# Patient Record
Sex: Female | Born: 1940 | Hispanic: No | Marital: Single | Smoking: Former smoker
Health system: Southern US, Community
[De-identification: ages and names within clinical notes are randomized; demographics above are authoritative.]

## PROBLEM LIST (undated history)

## (undated) DIAGNOSIS — E611 Iron deficiency: Secondary | ICD-10-CM

## (undated) DIAGNOSIS — M810 Age-related osteoporosis without current pathological fracture: Secondary | ICD-10-CM

## (undated) DIAGNOSIS — M5410 Radiculopathy, site unspecified: Secondary | ICD-10-CM

## (undated) DIAGNOSIS — Z9889 Other specified postprocedural states: Secondary | ICD-10-CM

## (undated) DIAGNOSIS — G894 Chronic pain syndrome: Secondary | ICD-10-CM

## (undated) DIAGNOSIS — I10 Essential (primary) hypertension: Secondary | ICD-10-CM

## (undated) DIAGNOSIS — F1120 Opioid dependence, uncomplicated: Secondary | ICD-10-CM

## (undated) DIAGNOSIS — N390 Urinary tract infection, site not specified: Secondary | ICD-10-CM

## (undated) DIAGNOSIS — C3411 Malignant neoplasm of upper lobe, right bronchus or lung: Secondary | ICD-10-CM

## (undated) DIAGNOSIS — E039 Hypothyroidism, unspecified: Secondary | ICD-10-CM

## (undated) DIAGNOSIS — F329 Major depressive disorder, single episode, unspecified: Secondary | ICD-10-CM

## (undated) DIAGNOSIS — N183 Chronic kidney disease, stage 3 (moderate): Secondary | ICD-10-CM

## (undated) DIAGNOSIS — G8929 Other chronic pain: Secondary | ICD-10-CM

## (undated) DIAGNOSIS — I824Y2 Acute embolism and thrombosis of unspecified deep veins of left proximal lower extremity: Secondary | ICD-10-CM

## (undated) DIAGNOSIS — N3020 Other chronic cystitis without hematuria: Secondary | ICD-10-CM

## (undated) DIAGNOSIS — M5136 Other intervertebral disc degeneration, lumbar region: Secondary | ICD-10-CM

## (undated) DIAGNOSIS — D649 Anemia, unspecified: Secondary | ICD-10-CM

## (undated) DIAGNOSIS — I739 Peripheral vascular disease, unspecified: Secondary | ICD-10-CM

## (undated) DIAGNOSIS — G40909 Epilepsy, unspecified, not intractable, without status epilepticus: Secondary | ICD-10-CM

## (undated) DIAGNOSIS — I82412 Acute embolism and thrombosis of left femoral vein: Secondary | ICD-10-CM

## (undated) DIAGNOSIS — J439 Emphysema, unspecified: Secondary | ICD-10-CM

## (undated) DIAGNOSIS — I6523 Occlusion and stenosis of bilateral carotid arteries: Secondary | ICD-10-CM

## (undated) DIAGNOSIS — Z79899 Other long term (current) drug therapy: Secondary | ICD-10-CM

## (undated) DIAGNOSIS — I119 Hypertensive heart disease without heart failure: Secondary | ICD-10-CM

## (undated) DIAGNOSIS — E785 Hyperlipidemia, unspecified: Secondary | ICD-10-CM

## (undated) DIAGNOSIS — Z9181 History of falling: Secondary | ICD-10-CM

## (undated) DIAGNOSIS — I771 Stricture of artery: Secondary | ICD-10-CM

## (undated) DIAGNOSIS — R6510 Systemic inflammatory response syndrome (SIRS) of non-infectious origin without acute organ dysfunction: Secondary | ICD-10-CM

## (undated) DIAGNOSIS — G6181 Chronic inflammatory demyelinating polyneuritis: Secondary | ICD-10-CM

## (undated) DIAGNOSIS — R0902 Hypoxemia: Secondary | ICD-10-CM

## (undated) DIAGNOSIS — M47817 Spondylosis without myelopathy or radiculopathy, lumbosacral region: Secondary | ICD-10-CM

## (undated) DIAGNOSIS — R55 Syncope and collapse: Secondary | ICD-10-CM

## (undated) DIAGNOSIS — J9610 Chronic respiratory failure, unspecified whether with hypoxia or hypercapnia: Secondary | ICD-10-CM

## (undated) DIAGNOSIS — I2510 Atherosclerotic heart disease of native coronary artery without angina pectoris: Secondary | ICD-10-CM

## (undated) DIAGNOSIS — K219 Gastro-esophageal reflux disease without esophagitis: Secondary | ICD-10-CM

## (undated) DIAGNOSIS — G039 Meningitis, unspecified: Secondary | ICD-10-CM

## (undated) DIAGNOSIS — M549 Dorsalgia, unspecified: Secondary | ICD-10-CM

## (undated) DIAGNOSIS — I6789 Other cerebrovascular disease: Secondary | ICD-10-CM

## (undated) DIAGNOSIS — M4726 Other spondylosis with radiculopathy, lumbar region: Secondary | ICD-10-CM

## (undated) DIAGNOSIS — Z86718 Personal history of other venous thrombosis and embolism: Secondary | ICD-10-CM

## (undated) DIAGNOSIS — M533 Sacrococcygeal disorders, not elsewhere classified: Secondary | ICD-10-CM

## (undated) DIAGNOSIS — R6251 Failure to thrive (child): Secondary | ICD-10-CM

## (undated) DIAGNOSIS — I2699 Other pulmonary embolism without acute cor pulmonale: Secondary | ICD-10-CM

## (undated) DIAGNOSIS — Z66 Do not resuscitate: Secondary | ICD-10-CM

## (undated) DIAGNOSIS — N281 Cyst of kidney, acquired: Secondary | ICD-10-CM

## (undated) DIAGNOSIS — I672 Cerebral atherosclerosis: Secondary | ICD-10-CM

## (undated) HISTORY — DX: Peripheral vascular disease, unspecified: I73.9

## (undated) HISTORY — DX: Hypertensive heart disease without heart failure: I11.9

## (undated) HISTORY — DX: Other long term (current) drug therapy: Z79.899

## (undated) HISTORY — DX: Acute embolism and thrombosis of unspecified deep veins of left proximal lower extremity: I82.4Y2

## (undated) HISTORY — DX: Other chronic pain: G89.29

## (undated) HISTORY — DX: Hypoxemia: R09.02

## (undated) HISTORY — DX: Other spondylosis with radiculopathy, lumbar region: M47.26

## (undated) HISTORY — DX: Sacrococcygeal disorders, not elsewhere classified: M53.3

## (undated) HISTORY — DX: Occlusion and stenosis of bilateral carotid arteries: I65.23

## (undated) HISTORY — DX: Hypothyroidism, unspecified: E03.9

## (undated) HISTORY — DX: Other intervertebral disc degeneration, lumbar region: M51.36

## (undated) HISTORY — DX: Acute embolism and thrombosis of left femoral vein: I82.412

## (undated) HISTORY — DX: Other pulmonary embolism without acute cor pulmonale: I26.99

## (undated) HISTORY — DX: Essential (primary) hypertension: I10

## (undated) HISTORY — DX: Chronic kidney disease, stage 3 (moderate): N18.3

## (undated) HISTORY — PX: JOINT REPLACEMENT: SHX530

## (undated) HISTORY — DX: Chronic inflammatory demyelinating polyneuritis: G61.81

## (undated) HISTORY — DX: Systemic inflammatory response syndrome (sirs) of non-infectious origin without acute organ dysfunction: R65.10

## (undated) HISTORY — DX: Stricture of artery: I77.1

## (undated) HISTORY — DX: Radiculopathy, site unspecified: M54.10

## (undated) HISTORY — DX: Urinary tract infection, site not specified: N39.0

## (undated) HISTORY — DX: Dorsalgia, unspecified: M54.9

## (undated) HISTORY — DX: Failure to thrive (child): R62.51

## (undated) HISTORY — DX: Epilepsy, unspecified, not intractable, without status epilepticus: G40.909

## (undated) HISTORY — DX: Gastro-esophageal reflux disease without esophagitis: K21.9

## (undated) HISTORY — DX: Anemia, unspecified: D64.9

## (undated) HISTORY — DX: Cerebral atherosclerosis: I67.2

## (undated) HISTORY — DX: Chronic pain syndrome: G89.4

## (undated) HISTORY — PX: BACK SURGERY: SHX140

## (undated) HISTORY — DX: Other specified postprocedural states: Z98.890

## (undated) HISTORY — DX: Age-related osteoporosis without current pathological fracture: M81.0

## (undated) HISTORY — DX: Spondylosis without myelopathy or radiculopathy, lumbosacral region: M47.817

## (undated) HISTORY — PX: APPENDECTOMY: SHX54

## (undated) HISTORY — DX: Hyperlipidemia, unspecified: E78.5

## (undated) HISTORY — DX: Do not resuscitate: Z66

## (undated) HISTORY — DX: Other chronic cystitis without hematuria: N30.20

## (undated) HISTORY — DX: Personal history of other venous thrombosis and embolism: Z86.718

## (undated) HISTORY — DX: Chronic respiratory failure, unspecified whether with hypoxia or hypercapnia: J96.10

## (undated) HISTORY — DX: History of falling: Z91.81

## (undated) HISTORY — DX: Malignant neoplasm of upper lobe, right bronchus or lung: C34.11

## (undated) HISTORY — DX: Atherosclerotic heart disease of native coronary artery without angina pectoris: I25.10

## (undated) HISTORY — DX: Opioid dependence, uncomplicated: F11.20

## (undated) HISTORY — DX: Cyst of kidney, acquired: N28.1

## (undated) HISTORY — DX: Emphysema, unspecified: J43.9

## (undated) HISTORY — DX: Other cerebrovascular disease: I67.89

## (undated) HISTORY — DX: Major depressive disorder, single episode, unspecified: F32.9

## (undated) HISTORY — DX: Iron deficiency: E61.1

## (undated) HISTORY — DX: Syncope and collapse: R55

## (undated) HISTORY — DX: Meningitis, unspecified: G03.9

---

## 1960-07-26 HISTORY — PX: VESICOVAGINAL FISTULA CLOSURE W/ TAH: SUR271

## 1988-07-26 HISTORY — PX: OTHER SURGICAL HISTORY: SHX169

## 2006-09-26 ENCOUNTER — Ambulatory Visit: Payer: Self-pay | Admitting: Vascular Surgery

## 2008-07-26 HISTORY — PX: SPINAL CORD STIMULATOR IMPLANT: SHX2422

## 2011-05-24 DIAGNOSIS — I10 Essential (primary) hypertension: Secondary | ICD-10-CM | POA: Insufficient documentation

## 2011-05-24 DIAGNOSIS — N183 Chronic kidney disease, stage 3 unspecified: Secondary | ICD-10-CM

## 2011-05-24 HISTORY — DX: Chronic kidney disease, stage 3 unspecified: N18.30

## 2011-05-24 HISTORY — DX: Essential (primary) hypertension: I10

## 2011-08-02 DIAGNOSIS — M810 Age-related osteoporosis without current pathological fracture: Secondary | ICD-10-CM | POA: Diagnosis not present

## 2011-08-02 DIAGNOSIS — K219 Gastro-esophageal reflux disease without esophagitis: Secondary | ICD-10-CM | POA: Diagnosis not present

## 2011-08-02 DIAGNOSIS — Z23 Encounter for immunization: Secondary | ICD-10-CM | POA: Diagnosis not present

## 2011-08-02 DIAGNOSIS — E039 Hypothyroidism, unspecified: Secondary | ICD-10-CM | POA: Diagnosis not present

## 2011-09-13 DIAGNOSIS — N182 Chronic kidney disease, stage 2 (mild): Secondary | ICD-10-CM | POA: Diagnosis not present

## 2011-09-13 DIAGNOSIS — Q619 Cystic kidney disease, unspecified: Secondary | ICD-10-CM | POA: Diagnosis not present

## 2011-09-13 DIAGNOSIS — I10 Essential (primary) hypertension: Secondary | ICD-10-CM | POA: Diagnosis not present

## 2011-09-13 DIAGNOSIS — I129 Hypertensive chronic kidney disease with stage 1 through stage 4 chronic kidney disease, or unspecified chronic kidney disease: Secondary | ICD-10-CM | POA: Diagnosis not present

## 2011-09-13 DIAGNOSIS — E875 Hyperkalemia: Secondary | ICD-10-CM | POA: Diagnosis not present

## 2011-09-14 DIAGNOSIS — E611 Iron deficiency: Secondary | ICD-10-CM | POA: Insufficient documentation

## 2011-09-14 HISTORY — DX: Iron deficiency: E61.1

## 2011-09-15 DIAGNOSIS — M48061 Spinal stenosis, lumbar region without neurogenic claudication: Secondary | ICD-10-CM | POA: Diagnosis not present

## 2011-09-15 DIAGNOSIS — G8929 Other chronic pain: Secondary | ICD-10-CM | POA: Diagnosis not present

## 2011-09-20 DIAGNOSIS — M519 Unspecified thoracic, thoracolumbar and lumbosacral intervertebral disc disorder: Secondary | ICD-10-CM | POA: Diagnosis not present

## 2011-09-20 DIAGNOSIS — M4310 Spondylolisthesis, site unspecified: Secondary | ICD-10-CM | POA: Diagnosis not present

## 2011-09-20 DIAGNOSIS — IMO0002 Reserved for concepts with insufficient information to code with codable children: Secondary | ICD-10-CM | POA: Diagnosis not present

## 2011-09-20 DIAGNOSIS — M47817 Spondylosis without myelopathy or radiculopathy, lumbosacral region: Secondary | ICD-10-CM | POA: Diagnosis not present

## 2011-10-20 DIAGNOSIS — I2510 Atherosclerotic heart disease of native coronary artery without angina pectoris: Secondary | ICD-10-CM | POA: Diagnosis not present

## 2011-10-20 DIAGNOSIS — Z01810 Encounter for preprocedural cardiovascular examination: Secondary | ICD-10-CM | POA: Diagnosis not present

## 2011-10-29 DIAGNOSIS — H3581 Retinal edema: Secondary | ICD-10-CM | POA: Diagnosis not present

## 2011-10-29 DIAGNOSIS — H2589 Other age-related cataract: Secondary | ICD-10-CM | POA: Diagnosis not present

## 2011-11-03 DIAGNOSIS — M79609 Pain in unspecified limb: Secondary | ICD-10-CM | POA: Diagnosis not present

## 2011-11-03 DIAGNOSIS — F329 Major depressive disorder, single episode, unspecified: Secondary | ICD-10-CM | POA: Diagnosis not present

## 2011-11-03 DIAGNOSIS — M47817 Spondylosis without myelopathy or radiculopathy, lumbosacral region: Secondary | ICD-10-CM | POA: Diagnosis not present

## 2011-11-04 DIAGNOSIS — IMO0002 Reserved for concepts with insufficient information to code with codable children: Secondary | ICD-10-CM | POA: Diagnosis not present

## 2011-11-04 DIAGNOSIS — I700 Atherosclerosis of aorta: Secondary | ICD-10-CM | POA: Diagnosis not present

## 2011-11-04 DIAGNOSIS — Z880 Allergy status to penicillin: Secondary | ICD-10-CM | POA: Diagnosis not present

## 2011-11-04 DIAGNOSIS — Z7982 Long term (current) use of aspirin: Secondary | ICD-10-CM | POA: Diagnosis not present

## 2011-11-04 DIAGNOSIS — M5137 Other intervertebral disc degeneration, lumbosacral region: Secondary | ICD-10-CM | POA: Diagnosis not present

## 2011-11-04 DIAGNOSIS — Z79899 Other long term (current) drug therapy: Secondary | ICD-10-CM | POA: Diagnosis not present

## 2011-11-04 DIAGNOSIS — Z888 Allergy status to other drugs, medicaments and biological substances status: Secondary | ICD-10-CM | POA: Diagnosis not present

## 2011-11-04 DIAGNOSIS — M4310 Spondylolisthesis, site unspecified: Secondary | ICD-10-CM | POA: Diagnosis not present

## 2011-11-04 DIAGNOSIS — M48062 Spinal stenosis, lumbar region with neurogenic claudication: Secondary | ICD-10-CM | POA: Diagnosis not present

## 2011-11-04 DIAGNOSIS — Z96649 Presence of unspecified artificial hip joint: Secondary | ICD-10-CM | POA: Diagnosis not present

## 2011-11-04 DIAGNOSIS — Z87891 Personal history of nicotine dependence: Secondary | ICD-10-CM | POA: Diagnosis not present

## 2011-11-04 DIAGNOSIS — Z90710 Acquired absence of both cervix and uterus: Secondary | ICD-10-CM | POA: Diagnosis not present

## 2011-11-04 DIAGNOSIS — M47817 Spondylosis without myelopathy or radiculopathy, lumbosacral region: Secondary | ICD-10-CM | POA: Diagnosis not present

## 2011-11-18 DIAGNOSIS — I119 Hypertensive heart disease without heart failure: Secondary | ICD-10-CM | POA: Diagnosis not present

## 2011-11-24 DIAGNOSIS — E785 Hyperlipidemia, unspecified: Secondary | ICD-10-CM | POA: Diagnosis not present

## 2011-11-24 DIAGNOSIS — Z01810 Encounter for preprocedural cardiovascular examination: Secondary | ICD-10-CM | POA: Diagnosis not present

## 2011-11-24 DIAGNOSIS — I2510 Atherosclerotic heart disease of native coronary artery without angina pectoris: Secondary | ICD-10-CM | POA: Diagnosis not present

## 2011-11-24 DIAGNOSIS — I119 Hypertensive heart disease without heart failure: Secondary | ICD-10-CM | POA: Diagnosis not present

## 2011-12-21 DIAGNOSIS — Z01810 Encounter for preprocedural cardiovascular examination: Secondary | ICD-10-CM | POA: Diagnosis not present

## 2011-12-21 DIAGNOSIS — I2510 Atherosclerotic heart disease of native coronary artery without angina pectoris: Secondary | ICD-10-CM | POA: Insufficient documentation

## 2011-12-21 DIAGNOSIS — Z01818 Encounter for other preprocedural examination: Secondary | ICD-10-CM | POA: Diagnosis not present

## 2011-12-21 DIAGNOSIS — R791 Abnormal coagulation profile: Secondary | ICD-10-CM | POA: Diagnosis not present

## 2011-12-21 DIAGNOSIS — E039 Hypothyroidism, unspecified: Secondary | ICD-10-CM

## 2011-12-21 DIAGNOSIS — M48062 Spinal stenosis, lumbar region with neurogenic claudication: Secondary | ICD-10-CM | POA: Diagnosis not present

## 2011-12-21 DIAGNOSIS — IMO0002 Reserved for concepts with insufficient information to code with codable children: Secondary | ICD-10-CM | POA: Diagnosis not present

## 2011-12-21 DIAGNOSIS — M5126 Other intervertebral disc displacement, lumbar region: Secondary | ICD-10-CM | POA: Diagnosis not present

## 2011-12-21 DIAGNOSIS — I129 Hypertensive chronic kidney disease with stage 1 through stage 4 chronic kidney disease, or unspecified chronic kidney disease: Secondary | ICD-10-CM | POA: Diagnosis not present

## 2011-12-21 HISTORY — DX: Atherosclerotic heart disease of native coronary artery without angina pectoris: I25.10

## 2011-12-21 HISTORY — DX: Hypothyroidism, unspecified: E03.9

## 2011-12-29 DIAGNOSIS — G9349 Other encephalopathy: Secondary | ICD-10-CM | POA: Diagnosis not present

## 2011-12-29 DIAGNOSIS — R52 Pain, unspecified: Secondary | ICD-10-CM | POA: Diagnosis not present

## 2011-12-29 DIAGNOSIS — D72829 Elevated white blood cell count, unspecified: Secondary | ICD-10-CM | POA: Diagnosis not present

## 2011-12-29 DIAGNOSIS — A0472 Enterocolitis due to Clostridium difficile, not specified as recurrent: Secondary | ICD-10-CM | POA: Diagnosis not present

## 2011-12-29 DIAGNOSIS — IMO0002 Reserved for concepts with insufficient information to code with codable children: Secondary | ICD-10-CM | POA: Diagnosis present

## 2011-12-29 DIAGNOSIS — Q762 Congenital spondylolisthesis: Secondary | ICD-10-CM | POA: Diagnosis not present

## 2011-12-29 DIAGNOSIS — Z01818 Encounter for other preprocedural examination: Secondary | ICD-10-CM | POA: Diagnosis not present

## 2011-12-29 DIAGNOSIS — Z472 Encounter for removal of internal fixation device: Secondary | ICD-10-CM | POA: Diagnosis not present

## 2011-12-29 DIAGNOSIS — G589 Mononeuropathy, unspecified: Secondary | ICD-10-CM | POA: Diagnosis not present

## 2011-12-29 DIAGNOSIS — G936 Cerebral edema: Secondary | ICD-10-CM | POA: Diagnosis not present

## 2011-12-29 DIAGNOSIS — E785 Hyperlipidemia, unspecified: Secondary | ICD-10-CM | POA: Diagnosis not present

## 2011-12-29 DIAGNOSIS — R569 Unspecified convulsions: Secondary | ICD-10-CM | POA: Diagnosis not present

## 2011-12-29 DIAGNOSIS — I10 Essential (primary) hypertension: Secondary | ICD-10-CM | POA: Diagnosis not present

## 2011-12-29 DIAGNOSIS — I6200 Nontraumatic subdural hemorrhage, unspecified: Secondary | ICD-10-CM | POA: Diagnosis not present

## 2011-12-29 DIAGNOSIS — I129 Hypertensive chronic kidney disease with stage 1 through stage 4 chronic kidney disease, or unspecified chronic kidney disease: Secondary | ICD-10-CM | POA: Diagnosis present

## 2011-12-29 DIAGNOSIS — R4701 Aphasia: Secondary | ICD-10-CM | POA: Diagnosis not present

## 2011-12-29 DIAGNOSIS — M48062 Spinal stenosis, lumbar region with neurogenic claudication: Secondary | ICD-10-CM | POA: Diagnosis not present

## 2011-12-29 DIAGNOSIS — E039 Hypothyroidism, unspecified: Secondary | ICD-10-CM | POA: Diagnosis present

## 2011-12-29 DIAGNOSIS — M4310 Spondylolisthesis, site unspecified: Secondary | ICD-10-CM | POA: Diagnosis not present

## 2011-12-29 DIAGNOSIS — I359 Nonrheumatic aortic valve disorder, unspecified: Secondary | ICD-10-CM | POA: Diagnosis not present

## 2011-12-29 DIAGNOSIS — I2510 Atherosclerotic heart disease of native coronary artery without angina pectoris: Secondary | ICD-10-CM | POA: Diagnosis present

## 2011-12-29 DIAGNOSIS — N390 Urinary tract infection, site not specified: Secondary | ICD-10-CM | POA: Diagnosis not present

## 2011-12-29 DIAGNOSIS — K5900 Constipation, unspecified: Secondary | ICD-10-CM | POA: Diagnosis not present

## 2011-12-29 DIAGNOSIS — R062 Wheezing: Secondary | ICD-10-CM | POA: Diagnosis not present

## 2011-12-29 DIAGNOSIS — Z87891 Personal history of nicotine dependence: Secondary | ICD-10-CM | POA: Diagnosis not present

## 2011-12-29 DIAGNOSIS — I498 Other specified cardiac arrhythmias: Secondary | ICD-10-CM | POA: Diagnosis not present

## 2011-12-29 DIAGNOSIS — D62 Acute posthemorrhagic anemia: Secondary | ICD-10-CM | POA: Diagnosis not present

## 2012-01-06 DIAGNOSIS — G936 Cerebral edema: Secondary | ICD-10-CM | POA: Insufficient documentation

## 2012-01-12 DIAGNOSIS — IMO0001 Reserved for inherently not codable concepts without codable children: Secondary | ICD-10-CM | POA: Diagnosis not present

## 2012-01-12 DIAGNOSIS — I2510 Atherosclerotic heart disease of native coronary artery without angina pectoris: Secondary | ICD-10-CM | POA: Diagnosis not present

## 2012-01-12 DIAGNOSIS — Z4789 Encounter for other orthopedic aftercare: Secondary | ICD-10-CM | POA: Diagnosis not present

## 2012-01-12 DIAGNOSIS — I10 Essential (primary) hypertension: Secondary | ICD-10-CM | POA: Diagnosis not present

## 2012-01-14 DIAGNOSIS — Z4789 Encounter for other orthopedic aftercare: Secondary | ICD-10-CM | POA: Diagnosis not present

## 2012-01-14 DIAGNOSIS — I10 Essential (primary) hypertension: Secondary | ICD-10-CM | POA: Diagnosis not present

## 2012-01-14 DIAGNOSIS — IMO0001 Reserved for inherently not codable concepts without codable children: Secondary | ICD-10-CM | POA: Diagnosis not present

## 2012-01-14 DIAGNOSIS — I2510 Atherosclerotic heart disease of native coronary artery without angina pectoris: Secondary | ICD-10-CM | POA: Diagnosis not present

## 2012-01-17 DIAGNOSIS — I2510 Atherosclerotic heart disease of native coronary artery without angina pectoris: Secondary | ICD-10-CM | POA: Diagnosis not present

## 2012-01-17 DIAGNOSIS — I10 Essential (primary) hypertension: Secondary | ICD-10-CM | POA: Diagnosis not present

## 2012-01-17 DIAGNOSIS — Z4789 Encounter for other orthopedic aftercare: Secondary | ICD-10-CM | POA: Diagnosis not present

## 2012-01-17 DIAGNOSIS — IMO0001 Reserved for inherently not codable concepts without codable children: Secondary | ICD-10-CM | POA: Diagnosis not present

## 2012-01-19 DIAGNOSIS — Z4789 Encounter for other orthopedic aftercare: Secondary | ICD-10-CM | POA: Diagnosis not present

## 2012-01-19 DIAGNOSIS — IMO0001 Reserved for inherently not codable concepts without codable children: Secondary | ICD-10-CM | POA: Diagnosis not present

## 2012-01-19 DIAGNOSIS — I2510 Atherosclerotic heart disease of native coronary artery without angina pectoris: Secondary | ICD-10-CM | POA: Diagnosis not present

## 2012-01-19 DIAGNOSIS — I10 Essential (primary) hypertension: Secondary | ICD-10-CM | POA: Diagnosis not present

## 2012-01-21 DIAGNOSIS — I2510 Atherosclerotic heart disease of native coronary artery without angina pectoris: Secondary | ICD-10-CM | POA: Diagnosis not present

## 2012-01-21 DIAGNOSIS — IMO0001 Reserved for inherently not codable concepts without codable children: Secondary | ICD-10-CM | POA: Diagnosis not present

## 2012-01-21 DIAGNOSIS — I10 Essential (primary) hypertension: Secondary | ICD-10-CM | POA: Diagnosis not present

## 2012-01-21 DIAGNOSIS — Z4789 Encounter for other orthopedic aftercare: Secondary | ICD-10-CM | POA: Diagnosis not present

## 2012-01-24 DIAGNOSIS — I2510 Atherosclerotic heart disease of native coronary artery without angina pectoris: Secondary | ICD-10-CM | POA: Diagnosis not present

## 2012-01-24 DIAGNOSIS — Z4789 Encounter for other orthopedic aftercare: Secondary | ICD-10-CM | POA: Diagnosis not present

## 2012-01-24 DIAGNOSIS — I10 Essential (primary) hypertension: Secondary | ICD-10-CM | POA: Diagnosis not present

## 2012-01-24 DIAGNOSIS — IMO0001 Reserved for inherently not codable concepts without codable children: Secondary | ICD-10-CM | POA: Diagnosis not present

## 2012-01-26 DIAGNOSIS — I2510 Atherosclerotic heart disease of native coronary artery without angina pectoris: Secondary | ICD-10-CM | POA: Diagnosis not present

## 2012-01-26 DIAGNOSIS — IMO0001 Reserved for inherently not codable concepts without codable children: Secondary | ICD-10-CM | POA: Diagnosis not present

## 2012-01-26 DIAGNOSIS — Z4789 Encounter for other orthopedic aftercare: Secondary | ICD-10-CM | POA: Diagnosis not present

## 2012-01-26 DIAGNOSIS — I10 Essential (primary) hypertension: Secondary | ICD-10-CM | POA: Diagnosis not present

## 2012-01-28 DIAGNOSIS — I10 Essential (primary) hypertension: Secondary | ICD-10-CM | POA: Diagnosis not present

## 2012-01-28 DIAGNOSIS — IMO0001 Reserved for inherently not codable concepts without codable children: Secondary | ICD-10-CM | POA: Diagnosis not present

## 2012-01-28 DIAGNOSIS — Z4789 Encounter for other orthopedic aftercare: Secondary | ICD-10-CM | POA: Diagnosis not present

## 2012-01-28 DIAGNOSIS — I2510 Atherosclerotic heart disease of native coronary artery without angina pectoris: Secondary | ICD-10-CM | POA: Diagnosis not present

## 2012-01-31 DIAGNOSIS — I10 Essential (primary) hypertension: Secondary | ICD-10-CM | POA: Diagnosis not present

## 2012-01-31 DIAGNOSIS — Z4789 Encounter for other orthopedic aftercare: Secondary | ICD-10-CM | POA: Diagnosis not present

## 2012-01-31 DIAGNOSIS — I2510 Atherosclerotic heart disease of native coronary artery without angina pectoris: Secondary | ICD-10-CM | POA: Diagnosis not present

## 2012-01-31 DIAGNOSIS — IMO0001 Reserved for inherently not codable concepts without codable children: Secondary | ICD-10-CM | POA: Diagnosis not present

## 2012-02-02 DIAGNOSIS — Z4789 Encounter for other orthopedic aftercare: Secondary | ICD-10-CM | POA: Diagnosis not present

## 2012-02-02 DIAGNOSIS — I2510 Atherosclerotic heart disease of native coronary artery without angina pectoris: Secondary | ICD-10-CM | POA: Diagnosis not present

## 2012-02-02 DIAGNOSIS — I10 Essential (primary) hypertension: Secondary | ICD-10-CM | POA: Diagnosis not present

## 2012-02-02 DIAGNOSIS — IMO0001 Reserved for inherently not codable concepts without codable children: Secondary | ICD-10-CM | POA: Diagnosis not present

## 2012-02-03 DIAGNOSIS — M545 Low back pain, unspecified: Secondary | ICD-10-CM | POA: Diagnosis not present

## 2012-02-03 DIAGNOSIS — Z9889 Other specified postprocedural states: Secondary | ICD-10-CM

## 2012-02-03 DIAGNOSIS — M5030 Other cervical disc degeneration, unspecified cervical region: Secondary | ICD-10-CM | POA: Diagnosis not present

## 2012-02-03 DIAGNOSIS — R51 Headache: Secondary | ICD-10-CM | POA: Diagnosis not present

## 2012-02-03 DIAGNOSIS — G936 Cerebral edema: Secondary | ICD-10-CM | POA: Diagnosis not present

## 2012-02-03 HISTORY — DX: Other specified postprocedural states: Z98.890

## 2012-02-04 DIAGNOSIS — I10 Essential (primary) hypertension: Secondary | ICD-10-CM | POA: Diagnosis not present

## 2012-02-04 DIAGNOSIS — Z4789 Encounter for other orthopedic aftercare: Secondary | ICD-10-CM | POA: Diagnosis not present

## 2012-02-04 DIAGNOSIS — IMO0001 Reserved for inherently not codable concepts without codable children: Secondary | ICD-10-CM | POA: Diagnosis not present

## 2012-02-04 DIAGNOSIS — I2510 Atherosclerotic heart disease of native coronary artery without angina pectoris: Secondary | ICD-10-CM | POA: Diagnosis not present

## 2012-02-07 DIAGNOSIS — I2510 Atherosclerotic heart disease of native coronary artery without angina pectoris: Secondary | ICD-10-CM | POA: Diagnosis not present

## 2012-02-07 DIAGNOSIS — IMO0001 Reserved for inherently not codable concepts without codable children: Secondary | ICD-10-CM | POA: Diagnosis not present

## 2012-02-07 DIAGNOSIS — I10 Essential (primary) hypertension: Secondary | ICD-10-CM | POA: Diagnosis not present

## 2012-02-07 DIAGNOSIS — Z4789 Encounter for other orthopedic aftercare: Secondary | ICD-10-CM | POA: Diagnosis not present

## 2012-02-09 DIAGNOSIS — I10 Essential (primary) hypertension: Secondary | ICD-10-CM | POA: Diagnosis not present

## 2012-02-09 DIAGNOSIS — IMO0001 Reserved for inherently not codable concepts without codable children: Secondary | ICD-10-CM | POA: Diagnosis not present

## 2012-02-09 DIAGNOSIS — Z4789 Encounter for other orthopedic aftercare: Secondary | ICD-10-CM | POA: Diagnosis not present

## 2012-02-09 DIAGNOSIS — I2510 Atherosclerotic heart disease of native coronary artery without angina pectoris: Secondary | ICD-10-CM | POA: Diagnosis not present

## 2012-02-11 DIAGNOSIS — I2510 Atherosclerotic heart disease of native coronary artery without angina pectoris: Secondary | ICD-10-CM | POA: Diagnosis not present

## 2012-02-11 DIAGNOSIS — IMO0001 Reserved for inherently not codable concepts without codable children: Secondary | ICD-10-CM | POA: Diagnosis not present

## 2012-02-11 DIAGNOSIS — I10 Essential (primary) hypertension: Secondary | ICD-10-CM | POA: Diagnosis not present

## 2012-02-11 DIAGNOSIS — Z4789 Encounter for other orthopedic aftercare: Secondary | ICD-10-CM | POA: Diagnosis not present

## 2012-02-15 DIAGNOSIS — Z4789 Encounter for other orthopedic aftercare: Secondary | ICD-10-CM | POA: Diagnosis not present

## 2012-02-15 DIAGNOSIS — IMO0001 Reserved for inherently not codable concepts without codable children: Secondary | ICD-10-CM | POA: Diagnosis not present

## 2012-02-15 DIAGNOSIS — I2510 Atherosclerotic heart disease of native coronary artery without angina pectoris: Secondary | ICD-10-CM | POA: Diagnosis not present

## 2012-02-15 DIAGNOSIS — I10 Essential (primary) hypertension: Secondary | ICD-10-CM | POA: Diagnosis not present

## 2012-02-18 DIAGNOSIS — Z4789 Encounter for other orthopedic aftercare: Secondary | ICD-10-CM | POA: Diagnosis not present

## 2012-02-18 DIAGNOSIS — I10 Essential (primary) hypertension: Secondary | ICD-10-CM | POA: Diagnosis not present

## 2012-02-18 DIAGNOSIS — I2510 Atherosclerotic heart disease of native coronary artery without angina pectoris: Secondary | ICD-10-CM | POA: Diagnosis not present

## 2012-02-18 DIAGNOSIS — IMO0001 Reserved for inherently not codable concepts without codable children: Secondary | ICD-10-CM | POA: Diagnosis not present

## 2012-02-22 DIAGNOSIS — I10 Essential (primary) hypertension: Secondary | ICD-10-CM | POA: Diagnosis not present

## 2012-02-22 DIAGNOSIS — IMO0001 Reserved for inherently not codable concepts without codable children: Secondary | ICD-10-CM | POA: Diagnosis not present

## 2012-02-22 DIAGNOSIS — Z4789 Encounter for other orthopedic aftercare: Secondary | ICD-10-CM | POA: Diagnosis not present

## 2012-02-22 DIAGNOSIS — I2510 Atherosclerotic heart disease of native coronary artery without angina pectoris: Secondary | ICD-10-CM | POA: Diagnosis not present

## 2012-02-23 DIAGNOSIS — Z4789 Encounter for other orthopedic aftercare: Secondary | ICD-10-CM | POA: Diagnosis not present

## 2012-02-23 DIAGNOSIS — IMO0001 Reserved for inherently not codable concepts without codable children: Secondary | ICD-10-CM | POA: Diagnosis not present

## 2012-02-23 DIAGNOSIS — I2510 Atherosclerotic heart disease of native coronary artery without angina pectoris: Secondary | ICD-10-CM | POA: Diagnosis not present

## 2012-02-23 DIAGNOSIS — I10 Essential (primary) hypertension: Secondary | ICD-10-CM | POA: Diagnosis not present

## 2012-02-28 ENCOUNTER — Telehealth: Payer: Self-pay | Admitting: *Deleted

## 2012-02-28 DIAGNOSIS — Z7982 Long term (current) use of aspirin: Secondary | ICD-10-CM | POA: Diagnosis not present

## 2012-02-28 DIAGNOSIS — Z9181 History of falling: Secondary | ICD-10-CM | POA: Diagnosis not present

## 2012-02-28 DIAGNOSIS — I6529 Occlusion and stenosis of unspecified carotid artery: Secondary | ICD-10-CM | POA: Diagnosis not present

## 2012-02-28 DIAGNOSIS — G609 Hereditary and idiopathic neuropathy, unspecified: Secondary | ICD-10-CM | POA: Diagnosis present

## 2012-02-28 DIAGNOSIS — G8928 Other chronic postprocedural pain: Secondary | ICD-10-CM | POA: Diagnosis present

## 2012-02-28 DIAGNOSIS — M48061 Spinal stenosis, lumbar region without neurogenic claudication: Secondary | ICD-10-CM | POA: Diagnosis not present

## 2012-02-28 DIAGNOSIS — M79609 Pain in unspecified limb: Secondary | ICD-10-CM | POA: Diagnosis not present

## 2012-02-28 DIAGNOSIS — G40309 Generalized idiopathic epilepsy and epileptic syndromes, not intractable, without status epilepticus: Secondary | ICD-10-CM | POA: Diagnosis not present

## 2012-02-28 DIAGNOSIS — Z9861 Coronary angioplasty status: Secondary | ICD-10-CM | POA: Diagnosis not present

## 2012-02-28 DIAGNOSIS — K219 Gastro-esophageal reflux disease without esophagitis: Secondary | ICD-10-CM | POA: Diagnosis not present

## 2012-02-28 DIAGNOSIS — M7989 Other specified soft tissue disorders: Secondary | ICD-10-CM | POA: Diagnosis not present

## 2012-02-28 DIAGNOSIS — Z7901 Long term (current) use of anticoagulants: Secondary | ICD-10-CM | POA: Diagnosis not present

## 2012-02-28 DIAGNOSIS — G40802 Other epilepsy, not intractable, without status epilepticus: Secondary | ICD-10-CM | POA: Diagnosis not present

## 2012-02-28 DIAGNOSIS — M549 Dorsalgia, unspecified: Secondary | ICD-10-CM | POA: Diagnosis not present

## 2012-02-28 DIAGNOSIS — M25476 Effusion, unspecified foot: Secondary | ICD-10-CM | POA: Diagnosis not present

## 2012-02-28 DIAGNOSIS — M25579 Pain in unspecified ankle and joints of unspecified foot: Secondary | ICD-10-CM | POA: Diagnosis not present

## 2012-02-28 DIAGNOSIS — M545 Low back pain: Secondary | ICD-10-CM | POA: Diagnosis present

## 2012-02-28 DIAGNOSIS — Z79899 Other long term (current) drug therapy: Secondary | ICD-10-CM | POA: Diagnosis not present

## 2012-02-28 DIAGNOSIS — M25473 Effusion, unspecified ankle: Secondary | ICD-10-CM | POA: Diagnosis not present

## 2012-02-28 DIAGNOSIS — I10 Essential (primary) hypertension: Secondary | ICD-10-CM | POA: Diagnosis not present

## 2012-02-28 DIAGNOSIS — B9689 Other specified bacterial agents as the cause of diseases classified elsewhere: Secondary | ICD-10-CM | POA: Diagnosis present

## 2012-02-28 DIAGNOSIS — I82409 Acute embolism and thrombosis of unspecified deep veins of unspecified lower extremity: Secondary | ICD-10-CM | POA: Diagnosis not present

## 2012-02-28 DIAGNOSIS — E039 Hypothyroidism, unspecified: Secondary | ICD-10-CM | POA: Diagnosis present

## 2012-02-28 DIAGNOSIS — R569 Unspecified convulsions: Secondary | ICD-10-CM | POA: Diagnosis not present

## 2012-02-28 DIAGNOSIS — G4440 Drug-induced headache, not elsewhere classified, not intractable: Secondary | ICD-10-CM | POA: Diagnosis present

## 2012-02-28 DIAGNOSIS — I739 Peripheral vascular disease, unspecified: Secondary | ICD-10-CM | POA: Diagnosis not present

## 2012-02-28 DIAGNOSIS — E785 Hyperlipidemia, unspecified: Secondary | ICD-10-CM | POA: Diagnosis not present

## 2012-02-28 DIAGNOSIS — M818 Other osteoporosis without current pathological fracture: Secondary | ICD-10-CM | POA: Diagnosis not present

## 2012-02-28 DIAGNOSIS — I2510 Atherosclerotic heart disease of native coronary artery without angina pectoris: Secondary | ICD-10-CM | POA: Diagnosis not present

## 2012-02-28 DIAGNOSIS — N390 Urinary tract infection, site not specified: Secondary | ICD-10-CM | POA: Diagnosis not present

## 2012-03-02 NOTE — Telephone Encounter (Signed)
review 

## 2012-03-04 DIAGNOSIS — I10 Essential (primary) hypertension: Secondary | ICD-10-CM | POA: Diagnosis not present

## 2012-03-04 DIAGNOSIS — Z4789 Encounter for other orthopedic aftercare: Secondary | ICD-10-CM | POA: Diagnosis not present

## 2012-03-04 DIAGNOSIS — IMO0001 Reserved for inherently not codable concepts without codable children: Secondary | ICD-10-CM | POA: Diagnosis not present

## 2012-03-04 DIAGNOSIS — I2510 Atherosclerotic heart disease of native coronary artery without angina pectoris: Secondary | ICD-10-CM | POA: Diagnosis not present

## 2012-03-06 DIAGNOSIS — I803 Phlebitis and thrombophlebitis of lower extremities, unspecified: Secondary | ICD-10-CM | POA: Diagnosis not present

## 2012-03-06 DIAGNOSIS — I10 Essential (primary) hypertension: Secondary | ICD-10-CM | POA: Diagnosis not present

## 2012-03-06 DIAGNOSIS — Z79899 Other long term (current) drug therapy: Secondary | ICD-10-CM | POA: Diagnosis not present

## 2012-03-08 DIAGNOSIS — Z9861 Coronary angioplasty status: Secondary | ICD-10-CM | POA: Diagnosis not present

## 2012-03-08 DIAGNOSIS — I129 Hypertensive chronic kidney disease with stage 1 through stage 4 chronic kidney disease, or unspecified chronic kidney disease: Secondary | ICD-10-CM | POA: Diagnosis not present

## 2012-03-08 DIAGNOSIS — M1990 Unspecified osteoarthritis, unspecified site: Secondary | ICD-10-CM | POA: Diagnosis present

## 2012-03-08 DIAGNOSIS — Z7982 Long term (current) use of aspirin: Secondary | ICD-10-CM | POA: Diagnosis not present

## 2012-03-08 DIAGNOSIS — R627 Adult failure to thrive: Secondary | ICD-10-CM | POA: Diagnosis not present

## 2012-03-08 DIAGNOSIS — R5383 Other fatigue: Secondary | ICD-10-CM | POA: Diagnosis not present

## 2012-03-08 DIAGNOSIS — N179 Acute kidney failure, unspecified: Secondary | ICD-10-CM | POA: Diagnosis not present

## 2012-03-08 DIAGNOSIS — Z7901 Long term (current) use of anticoagulants: Secondary | ICD-10-CM | POA: Diagnosis not present

## 2012-03-08 DIAGNOSIS — Z86718 Personal history of other venous thrombosis and embolism: Secondary | ICD-10-CM | POA: Diagnosis not present

## 2012-03-08 DIAGNOSIS — G8929 Other chronic pain: Secondary | ICD-10-CM | POA: Diagnosis present

## 2012-03-08 DIAGNOSIS — I2510 Atherosclerotic heart disease of native coronary artery without angina pectoris: Secondary | ICD-10-CM | POA: Diagnosis not present

## 2012-03-08 DIAGNOSIS — E860 Dehydration: Secondary | ICD-10-CM | POA: Diagnosis not present

## 2012-03-08 DIAGNOSIS — Z6825 Body mass index (BMI) 25.0-25.9, adult: Secondary | ICD-10-CM | POA: Diagnosis not present

## 2012-03-08 DIAGNOSIS — R5381 Other malaise: Secondary | ICD-10-CM | POA: Diagnosis not present

## 2012-03-08 DIAGNOSIS — R51 Headache: Secondary | ICD-10-CM | POA: Diagnosis not present

## 2012-03-08 DIAGNOSIS — R630 Anorexia: Secondary | ICD-10-CM | POA: Diagnosis not present

## 2012-03-08 DIAGNOSIS — I10 Essential (primary) hypertension: Secondary | ICD-10-CM | POA: Diagnosis not present

## 2012-03-08 DIAGNOSIS — K219 Gastro-esophageal reflux disease without esophagitis: Secondary | ICD-10-CM | POA: Diagnosis present

## 2012-03-08 DIAGNOSIS — M549 Dorsalgia, unspecified: Secondary | ICD-10-CM | POA: Diagnosis present

## 2012-03-08 DIAGNOSIS — IMO0001 Reserved for inherently not codable concepts without codable children: Secondary | ICD-10-CM | POA: Diagnosis not present

## 2012-03-08 DIAGNOSIS — M810 Age-related osteoporosis without current pathological fracture: Secondary | ICD-10-CM | POA: Diagnosis present

## 2012-03-08 DIAGNOSIS — N390 Urinary tract infection, site not specified: Secondary | ICD-10-CM | POA: Diagnosis not present

## 2012-03-08 DIAGNOSIS — Z87891 Personal history of nicotine dependence: Secondary | ICD-10-CM | POA: Diagnosis not present

## 2012-03-08 DIAGNOSIS — G40909 Epilepsy, unspecified, not intractable, without status epilepticus: Secondary | ICD-10-CM | POA: Diagnosis present

## 2012-03-08 DIAGNOSIS — Z4789 Encounter for other orthopedic aftercare: Secondary | ICD-10-CM | POA: Diagnosis not present

## 2012-03-08 DIAGNOSIS — I739 Peripheral vascular disease, unspecified: Secondary | ICD-10-CM | POA: Diagnosis present

## 2012-03-11 DIAGNOSIS — Z4789 Encounter for other orthopedic aftercare: Secondary | ICD-10-CM | POA: Diagnosis not present

## 2012-03-11 DIAGNOSIS — IMO0001 Reserved for inherently not codable concepts without codable children: Secondary | ICD-10-CM | POA: Diagnosis not present

## 2012-03-11 DIAGNOSIS — I10 Essential (primary) hypertension: Secondary | ICD-10-CM | POA: Diagnosis not present

## 2012-03-11 DIAGNOSIS — I2510 Atherosclerotic heart disease of native coronary artery without angina pectoris: Secondary | ICD-10-CM | POA: Diagnosis not present

## 2012-03-12 DIAGNOSIS — T45511A Poisoning by anticoagulants, accidental (unintentional), initial encounter: Secondary | ICD-10-CM | POA: Diagnosis not present

## 2012-03-12 DIAGNOSIS — E7800 Pure hypercholesterolemia, unspecified: Secondary | ICD-10-CM | POA: Diagnosis not present

## 2012-03-12 DIAGNOSIS — Z96649 Presence of unspecified artificial hip joint: Secondary | ICD-10-CM | POA: Diagnosis not present

## 2012-03-12 DIAGNOSIS — R5381 Other malaise: Secondary | ICD-10-CM | POA: Diagnosis not present

## 2012-03-12 DIAGNOSIS — G40401 Other generalized epilepsy and epileptic syndromes, not intractable, with status epilepticus: Secondary | ICD-10-CM | POA: Diagnosis not present

## 2012-03-12 DIAGNOSIS — I739 Peripheral vascular disease, unspecified: Secondary | ICD-10-CM | POA: Diagnosis not present

## 2012-03-12 DIAGNOSIS — Z7901 Long term (current) use of anticoagulants: Secondary | ICD-10-CM | POA: Diagnosis not present

## 2012-03-12 DIAGNOSIS — K922 Gastrointestinal hemorrhage, unspecified: Secondary | ICD-10-CM | POA: Diagnosis not present

## 2012-03-12 DIAGNOSIS — I82409 Acute embolism and thrombosis of unspecified deep veins of unspecified lower extremity: Secondary | ICD-10-CM | POA: Diagnosis not present

## 2012-03-12 DIAGNOSIS — K219 Gastro-esophageal reflux disease without esophagitis: Secondary | ICD-10-CM | POA: Diagnosis not present

## 2012-03-12 DIAGNOSIS — K2990 Gastroduodenitis, unspecified, without bleeding: Secondary | ICD-10-CM | POA: Diagnosis present

## 2012-03-12 DIAGNOSIS — R5383 Other fatigue: Secondary | ICD-10-CM | POA: Diagnosis not present

## 2012-03-12 DIAGNOSIS — K2970 Gastritis, unspecified, without bleeding: Secondary | ICD-10-CM | POA: Diagnosis not present

## 2012-03-12 DIAGNOSIS — Z87440 Personal history of urinary (tract) infections: Secondary | ICD-10-CM | POA: Diagnosis not present

## 2012-03-12 DIAGNOSIS — M1990 Unspecified osteoarthritis, unspecified site: Secondary | ICD-10-CM | POA: Diagnosis not present

## 2012-03-12 DIAGNOSIS — G609 Hereditary and idiopathic neuropathy, unspecified: Secondary | ICD-10-CM | POA: Diagnosis present

## 2012-03-12 DIAGNOSIS — R51 Headache: Secondary | ICD-10-CM | POA: Diagnosis not present

## 2012-03-12 DIAGNOSIS — Z9861 Coronary angioplasty status: Secondary | ICD-10-CM | POA: Diagnosis not present

## 2012-03-12 DIAGNOSIS — N390 Urinary tract infection, site not specified: Secondary | ICD-10-CM | POA: Diagnosis not present

## 2012-03-12 DIAGNOSIS — Z86718 Personal history of other venous thrombosis and embolism: Secondary | ICD-10-CM | POA: Diagnosis not present

## 2012-03-12 DIAGNOSIS — E039 Hypothyroidism, unspecified: Secondary | ICD-10-CM | POA: Diagnosis present

## 2012-03-12 DIAGNOSIS — Z87891 Personal history of nicotine dependence: Secondary | ICD-10-CM | POA: Diagnosis not present

## 2012-03-12 DIAGNOSIS — R791 Abnormal coagulation profile: Secondary | ICD-10-CM | POA: Diagnosis not present

## 2012-03-12 DIAGNOSIS — I2510 Atherosclerotic heart disease of native coronary artery without angina pectoris: Secondary | ICD-10-CM | POA: Diagnosis not present

## 2012-03-12 DIAGNOSIS — M810 Age-related osteoporosis without current pathological fracture: Secondary | ICD-10-CM | POA: Diagnosis present

## 2012-03-13 DIAGNOSIS — K922 Gastrointestinal hemorrhage, unspecified: Secondary | ICD-10-CM | POA: Diagnosis not present

## 2012-03-13 DIAGNOSIS — G40309 Generalized idiopathic epilepsy and epileptic syndromes, not intractable, without status epilepticus: Secondary | ICD-10-CM | POA: Diagnosis not present

## 2012-03-13 DIAGNOSIS — E785 Hyperlipidemia, unspecified: Secondary | ICD-10-CM | POA: Diagnosis not present

## 2012-03-13 DIAGNOSIS — R4182 Altered mental status, unspecified: Secondary | ICD-10-CM | POA: Diagnosis not present

## 2012-03-13 DIAGNOSIS — E7800 Pure hypercholesterolemia, unspecified: Secondary | ICD-10-CM | POA: Diagnosis not present

## 2012-03-13 DIAGNOSIS — I2510 Atherosclerotic heart disease of native coronary artery without angina pectoris: Secondary | ICD-10-CM | POA: Diagnosis not present

## 2012-03-13 DIAGNOSIS — Z7901 Long term (current) use of anticoagulants: Secondary | ICD-10-CM | POA: Diagnosis not present

## 2012-03-13 DIAGNOSIS — E039 Hypothyroidism, unspecified: Secondary | ICD-10-CM | POA: Diagnosis not present

## 2012-03-13 DIAGNOSIS — D7582 Heparin induced thrombocytopenia (HIT): Secondary | ICD-10-CM | POA: Diagnosis present

## 2012-03-13 DIAGNOSIS — G9349 Other encephalopathy: Secondary | ICD-10-CM | POA: Diagnosis present

## 2012-03-13 DIAGNOSIS — I82409 Acute embolism and thrombosis of unspecified deep veins of unspecified lower extremity: Secondary | ICD-10-CM | POA: Diagnosis not present

## 2012-03-13 DIAGNOSIS — G40401 Other generalized epilepsy and epileptic syndromes, not intractable, with status epilepticus: Secondary | ICD-10-CM | POA: Diagnosis not present

## 2012-03-13 DIAGNOSIS — Z87891 Personal history of nicotine dependence: Secondary | ICD-10-CM | POA: Diagnosis not present

## 2012-03-13 DIAGNOSIS — R569 Unspecified convulsions: Secondary | ICD-10-CM | POA: Diagnosis not present

## 2012-03-13 DIAGNOSIS — I10 Essential (primary) hypertension: Secondary | ICD-10-CM | POA: Diagnosis not present

## 2012-03-13 DIAGNOSIS — K2970 Gastritis, unspecified, without bleeding: Secondary | ICD-10-CM | POA: Diagnosis not present

## 2012-03-13 DIAGNOSIS — K219 Gastro-esophageal reflux disease without esophagitis: Secondary | ICD-10-CM | POA: Diagnosis present

## 2012-03-13 DIAGNOSIS — R791 Abnormal coagulation profile: Secondary | ICD-10-CM | POA: Diagnosis present

## 2012-03-13 DIAGNOSIS — G40909 Epilepsy, unspecified, not intractable, without status epilepticus: Secondary | ICD-10-CM

## 2012-03-13 DIAGNOSIS — Z7982 Long term (current) use of aspirin: Secondary | ICD-10-CM | POA: Diagnosis not present

## 2012-03-13 DIAGNOSIS — R51 Headache: Secondary | ICD-10-CM | POA: Diagnosis not present

## 2012-03-13 DIAGNOSIS — E46 Unspecified protein-calorie malnutrition: Secondary | ICD-10-CM | POA: Diagnosis not present

## 2012-03-13 DIAGNOSIS — Z79899 Other long term (current) drug therapy: Secondary | ICD-10-CM | POA: Diagnosis not present

## 2012-03-13 DIAGNOSIS — E876 Hypokalemia: Secondary | ICD-10-CM | POA: Diagnosis not present

## 2012-03-13 DIAGNOSIS — R5381 Other malaise: Secondary | ICD-10-CM | POA: Diagnosis not present

## 2012-03-13 DIAGNOSIS — I359 Nonrheumatic aortic valve disorder, unspecified: Secondary | ICD-10-CM | POA: Diagnosis not present

## 2012-03-13 DIAGNOSIS — Z981 Arthrodesis status: Secondary | ICD-10-CM | POA: Diagnosis not present

## 2012-03-13 DIAGNOSIS — I129 Hypertensive chronic kidney disease with stage 1 through stage 4 chronic kidney disease, or unspecified chronic kidney disease: Secondary | ICD-10-CM | POA: Diagnosis not present

## 2012-03-13 DIAGNOSIS — Z91038 Other insect allergy status: Secondary | ICD-10-CM | POA: Diagnosis not present

## 2012-03-13 DIAGNOSIS — A0472 Enterocolitis due to Clostridium difficile, not specified as recurrent: Secondary | ICD-10-CM | POA: Diagnosis not present

## 2012-03-13 DIAGNOSIS — K921 Melena: Secondary | ICD-10-CM | POA: Diagnosis present

## 2012-03-13 DIAGNOSIS — Z4682 Encounter for fitting and adjustment of non-vascular catheter: Secondary | ICD-10-CM | POA: Diagnosis not present

## 2012-03-13 DIAGNOSIS — Z888 Allergy status to other drugs, medicaments and biological substances status: Secondary | ICD-10-CM | POA: Diagnosis not present

## 2012-03-13 DIAGNOSIS — Z5189 Encounter for other specified aftercare: Secondary | ICD-10-CM | POA: Diagnosis not present

## 2012-03-13 DIAGNOSIS — G9389 Other specified disorders of brain: Secondary | ICD-10-CM | POA: Diagnosis not present

## 2012-03-13 DIAGNOSIS — Z418 Encounter for other procedures for purposes other than remedying health state: Secondary | ICD-10-CM | POA: Diagnosis not present

## 2012-03-13 DIAGNOSIS — R195 Other fecal abnormalities: Secondary | ICD-10-CM | POA: Diagnosis not present

## 2012-03-13 DIAGNOSIS — Z880 Allergy status to penicillin: Secondary | ICD-10-CM | POA: Diagnosis not present

## 2012-03-13 DIAGNOSIS — G6181 Chronic inflammatory demyelinating polyneuritis: Secondary | ICD-10-CM | POA: Diagnosis not present

## 2012-03-13 DIAGNOSIS — N183 Chronic kidney disease, stage 3 unspecified: Secondary | ICD-10-CM | POA: Diagnosis not present

## 2012-03-13 DIAGNOSIS — I739 Peripheral vascular disease, unspecified: Secondary | ICD-10-CM | POA: Diagnosis not present

## 2012-03-13 DIAGNOSIS — Z9861 Coronary angioplasty status: Secondary | ICD-10-CM | POA: Diagnosis not present

## 2012-03-13 DIAGNOSIS — Z96649 Presence of unspecified artificial hip joint: Secondary | ICD-10-CM | POA: Diagnosis not present

## 2012-03-13 DIAGNOSIS — M1990 Unspecified osteoarthritis, unspecified site: Secondary | ICD-10-CM | POA: Diagnosis not present

## 2012-03-13 HISTORY — DX: Epilepsy, unspecified, not intractable, without status epilepticus: G40.909

## 2012-03-21 DIAGNOSIS — G6181 Chronic inflammatory demyelinating polyneuritis: Secondary | ICD-10-CM | POA: Insufficient documentation

## 2012-03-21 DIAGNOSIS — R630 Anorexia: Secondary | ICD-10-CM | POA: Diagnosis not present

## 2012-03-21 DIAGNOSIS — E46 Unspecified protein-calorie malnutrition: Secondary | ICD-10-CM | POA: Diagnosis not present

## 2012-03-21 DIAGNOSIS — Z86718 Personal history of other venous thrombosis and embolism: Secondary | ICD-10-CM | POA: Diagnosis not present

## 2012-03-21 DIAGNOSIS — D509 Iron deficiency anemia, unspecified: Secondary | ICD-10-CM | POA: Diagnosis not present

## 2012-03-21 DIAGNOSIS — I82409 Acute embolism and thrombosis of unspecified deep veins of unspecified lower extremity: Secondary | ICD-10-CM | POA: Diagnosis not present

## 2012-03-21 DIAGNOSIS — G3184 Mild cognitive impairment, so stated: Secondary | ICD-10-CM | POA: Diagnosis not present

## 2012-03-21 DIAGNOSIS — N390 Urinary tract infection, site not specified: Secondary | ICD-10-CM | POA: Diagnosis not present

## 2012-03-21 DIAGNOSIS — R5381 Other malaise: Secondary | ICD-10-CM | POA: Diagnosis not present

## 2012-03-21 DIAGNOSIS — G40401 Other generalized epilepsy and epileptic syndromes, not intractable, with status epilepticus: Secondary | ICD-10-CM | POA: Diagnosis not present

## 2012-03-21 DIAGNOSIS — I2510 Atherosclerotic heart disease of native coronary artery without angina pectoris: Secondary | ICD-10-CM | POA: Diagnosis not present

## 2012-03-21 DIAGNOSIS — G4700 Insomnia, unspecified: Secondary | ICD-10-CM | POA: Diagnosis not present

## 2012-03-21 DIAGNOSIS — Z7901 Long term (current) use of anticoagulants: Secondary | ICD-10-CM | POA: Diagnosis not present

## 2012-03-21 DIAGNOSIS — E876 Hypokalemia: Secondary | ICD-10-CM | POA: Diagnosis not present

## 2012-03-21 DIAGNOSIS — B369 Superficial mycosis, unspecified: Secondary | ICD-10-CM | POA: Diagnosis not present

## 2012-03-21 DIAGNOSIS — M4120 Other idiopathic scoliosis, site unspecified: Secondary | ICD-10-CM | POA: Diagnosis not present

## 2012-03-21 DIAGNOSIS — Z418 Encounter for other procedures for purposes other than remedying health state: Secondary | ICD-10-CM | POA: Diagnosis not present

## 2012-03-21 DIAGNOSIS — R21 Rash and other nonspecific skin eruption: Secondary | ICD-10-CM | POA: Diagnosis not present

## 2012-03-21 DIAGNOSIS — Z981 Arthrodesis status: Secondary | ICD-10-CM | POA: Diagnosis not present

## 2012-03-21 DIAGNOSIS — Z781 Physical restraint status: Secondary | ICD-10-CM | POA: Diagnosis not present

## 2012-03-21 DIAGNOSIS — K219 Gastro-esophageal reflux disease without esophagitis: Secondary | ICD-10-CM | POA: Diagnosis not present

## 2012-03-21 DIAGNOSIS — Z5189 Encounter for other specified aftercare: Secondary | ICD-10-CM | POA: Diagnosis not present

## 2012-03-21 DIAGNOSIS — A0472 Enterocolitis due to Clostridium difficile, not specified as recurrent: Secondary | ICD-10-CM | POA: Diagnosis not present

## 2012-03-21 DIAGNOSIS — R51 Headache: Secondary | ICD-10-CM | POA: Diagnosis not present

## 2012-03-21 DIAGNOSIS — I10 Essential (primary) hypertension: Secondary | ICD-10-CM | POA: Diagnosis not present

## 2012-03-21 DIAGNOSIS — M545 Low back pain, unspecified: Secondary | ICD-10-CM | POA: Diagnosis not present

## 2012-03-21 DIAGNOSIS — G9349 Other encephalopathy: Secondary | ICD-10-CM | POA: Diagnosis not present

## 2012-03-21 DIAGNOSIS — I959 Hypotension, unspecified: Secondary | ICD-10-CM | POA: Diagnosis not present

## 2012-03-21 DIAGNOSIS — I129 Hypertensive chronic kidney disease with stage 1 through stage 4 chronic kidney disease, or unspecified chronic kidney disease: Secondary | ICD-10-CM | POA: Diagnosis not present

## 2012-03-21 DIAGNOSIS — E039 Hypothyroidism, unspecified: Secondary | ICD-10-CM | POA: Diagnosis not present

## 2012-03-21 HISTORY — DX: Chronic inflammatory demyelinating polyneuritis: G61.81

## 2012-04-07 DIAGNOSIS — I69921 Dysphasia following unspecified cerebrovascular disease: Secondary | ICD-10-CM | POA: Diagnosis not present

## 2012-04-07 DIAGNOSIS — R269 Unspecified abnormalities of gait and mobility: Secondary | ICD-10-CM | POA: Diagnosis not present

## 2012-04-07 DIAGNOSIS — M25559 Pain in unspecified hip: Secondary | ICD-10-CM | POA: Diagnosis not present

## 2012-04-07 DIAGNOSIS — M79609 Pain in unspecified limb: Secondary | ICD-10-CM | POA: Diagnosis not present

## 2012-04-07 DIAGNOSIS — Z5189 Encounter for other specified aftercare: Secondary | ICD-10-CM | POA: Diagnosis not present

## 2012-04-07 DIAGNOSIS — G603 Idiopathic progressive neuropathy: Secondary | ICD-10-CM | POA: Diagnosis not present

## 2012-04-07 DIAGNOSIS — M545 Low back pain: Secondary | ICD-10-CM | POA: Diagnosis not present

## 2012-04-07 DIAGNOSIS — M6281 Muscle weakness (generalized): Secondary | ICD-10-CM | POA: Diagnosis not present

## 2012-04-10 DIAGNOSIS — G6181 Chronic inflammatory demyelinating polyneuritis: Secondary | ICD-10-CM | POA: Diagnosis present

## 2012-04-10 DIAGNOSIS — I82509 Chronic embolism and thrombosis of unspecified deep veins of unspecified lower extremity: Secondary | ICD-10-CM | POA: Diagnosis present

## 2012-04-10 DIAGNOSIS — K219 Gastro-esophageal reflux disease without esophagitis: Secondary | ICD-10-CM | POA: Diagnosis present

## 2012-04-10 DIAGNOSIS — A4159 Other Gram-negative sepsis: Secondary | ICD-10-CM | POA: Diagnosis present

## 2012-04-10 DIAGNOSIS — I672 Cerebral atherosclerosis: Secondary | ICD-10-CM | POA: Insufficient documentation

## 2012-04-10 DIAGNOSIS — R791 Abnormal coagulation profile: Secondary | ICD-10-CM | POA: Diagnosis present

## 2012-04-10 DIAGNOSIS — Z9861 Coronary angioplasty status: Secondary | ICD-10-CM | POA: Diagnosis not present

## 2012-04-10 DIAGNOSIS — N183 Chronic kidney disease, stage 3 unspecified: Secondary | ICD-10-CM | POA: Diagnosis not present

## 2012-04-10 DIAGNOSIS — G40909 Epilepsy, unspecified, not intractable, without status epilepticus: Secondary | ICD-10-CM | POA: Diagnosis not present

## 2012-04-10 DIAGNOSIS — N390 Urinary tract infection, site not specified: Secondary | ICD-10-CM

## 2012-04-10 DIAGNOSIS — I959 Hypotension, unspecified: Secondary | ICD-10-CM | POA: Diagnosis not present

## 2012-04-10 DIAGNOSIS — M549 Dorsalgia, unspecified: Secondary | ICD-10-CM | POA: Diagnosis not present

## 2012-04-10 DIAGNOSIS — E039 Hypothyroidism, unspecified: Secondary | ICD-10-CM | POA: Diagnosis present

## 2012-04-10 DIAGNOSIS — N179 Acute kidney failure, unspecified: Secondary | ICD-10-CM | POA: Diagnosis not present

## 2012-04-10 DIAGNOSIS — D509 Iron deficiency anemia, unspecified: Secondary | ICD-10-CM | POA: Diagnosis present

## 2012-04-10 DIAGNOSIS — Z418 Encounter for other procedures for purposes other than remedying health state: Secondary | ICD-10-CM | POA: Diagnosis not present

## 2012-04-10 DIAGNOSIS — Z981 Arthrodesis status: Secondary | ICD-10-CM | POA: Diagnosis not present

## 2012-04-10 DIAGNOSIS — G8929 Other chronic pain: Secondary | ICD-10-CM

## 2012-04-10 DIAGNOSIS — Z86718 Personal history of other venous thrombosis and embolism: Secondary | ICD-10-CM

## 2012-04-10 DIAGNOSIS — Z7901 Long term (current) use of anticoagulants: Secondary | ICD-10-CM | POA: Diagnosis not present

## 2012-04-10 DIAGNOSIS — I2510 Atherosclerotic heart disease of native coronary artery without angina pectoris: Secondary | ICD-10-CM | POA: Diagnosis not present

## 2012-04-10 DIAGNOSIS — I129 Hypertensive chronic kidney disease with stage 1 through stage 4 chronic kidney disease, or unspecified chronic kidney disease: Secondary | ICD-10-CM | POA: Diagnosis not present

## 2012-04-10 DIAGNOSIS — I999 Unspecified disorder of circulatory system: Secondary | ICD-10-CM | POA: Diagnosis present

## 2012-04-10 DIAGNOSIS — M545 Low back pain: Secondary | ICD-10-CM | POA: Diagnosis present

## 2012-04-10 DIAGNOSIS — I82409 Acute embolism and thrombosis of unspecified deep veins of unspecified lower extremity: Secondary | ICD-10-CM | POA: Diagnosis not present

## 2012-04-10 DIAGNOSIS — A419 Sepsis, unspecified organism: Secondary | ICD-10-CM | POA: Diagnosis not present

## 2012-04-10 DIAGNOSIS — E869 Volume depletion, unspecified: Secondary | ICD-10-CM | POA: Diagnosis present

## 2012-04-10 HISTORY — DX: Other chronic pain: G89.29

## 2012-04-10 HISTORY — DX: Personal history of other venous thrombosis and embolism: Z86.718

## 2012-04-10 HISTORY — DX: Cerebral atherosclerosis: I67.2

## 2012-04-10 HISTORY — DX: Urinary tract infection, site not specified: N39.0

## 2012-04-13 DIAGNOSIS — Z23 Encounter for immunization: Secondary | ICD-10-CM | POA: Diagnosis not present

## 2012-04-13 DIAGNOSIS — Z79899 Other long term (current) drug therapy: Secondary | ICD-10-CM | POA: Diagnosis not present

## 2012-04-13 DIAGNOSIS — I803 Phlebitis and thrombophlebitis of lower extremities, unspecified: Secondary | ICD-10-CM | POA: Diagnosis not present

## 2012-04-13 DIAGNOSIS — Z7901 Long term (current) use of anticoagulants: Secondary | ICD-10-CM | POA: Diagnosis not present

## 2012-04-17 DIAGNOSIS — Z5189 Encounter for other specified aftercare: Secondary | ICD-10-CM | POA: Diagnosis not present

## 2012-04-17 DIAGNOSIS — M25559 Pain in unspecified hip: Secondary | ICD-10-CM | POA: Diagnosis not present

## 2012-04-17 DIAGNOSIS — I69921 Dysphasia following unspecified cerebrovascular disease: Secondary | ICD-10-CM | POA: Diagnosis not present

## 2012-04-17 DIAGNOSIS — M545 Low back pain: Secondary | ICD-10-CM | POA: Diagnosis not present

## 2012-04-17 DIAGNOSIS — M79609 Pain in unspecified limb: Secondary | ICD-10-CM | POA: Diagnosis not present

## 2012-04-17 DIAGNOSIS — M6281 Muscle weakness (generalized): Secondary | ICD-10-CM | POA: Diagnosis not present

## 2012-04-18 DIAGNOSIS — M79609 Pain in unspecified limb: Secondary | ICD-10-CM | POA: Diagnosis not present

## 2012-04-18 DIAGNOSIS — Z5189 Encounter for other specified aftercare: Secondary | ICD-10-CM | POA: Diagnosis not present

## 2012-04-18 DIAGNOSIS — M25559 Pain in unspecified hip: Secondary | ICD-10-CM | POA: Diagnosis not present

## 2012-04-18 DIAGNOSIS — I69921 Dysphasia following unspecified cerebrovascular disease: Secondary | ICD-10-CM | POA: Diagnosis not present

## 2012-04-18 DIAGNOSIS — M545 Low back pain: Secondary | ICD-10-CM | POA: Diagnosis not present

## 2012-04-18 DIAGNOSIS — M6281 Muscle weakness (generalized): Secondary | ICD-10-CM | POA: Diagnosis not present

## 2012-04-19 DIAGNOSIS — Z5189 Encounter for other specified aftercare: Secondary | ICD-10-CM | POA: Diagnosis not present

## 2012-04-19 DIAGNOSIS — M79609 Pain in unspecified limb: Secondary | ICD-10-CM | POA: Diagnosis not present

## 2012-04-19 DIAGNOSIS — M545 Low back pain: Secondary | ICD-10-CM | POA: Diagnosis not present

## 2012-04-19 DIAGNOSIS — M25559 Pain in unspecified hip: Secondary | ICD-10-CM | POA: Diagnosis not present

## 2012-04-19 DIAGNOSIS — M6281 Muscle weakness (generalized): Secondary | ICD-10-CM | POA: Diagnosis not present

## 2012-04-19 DIAGNOSIS — I69921 Dysphasia following unspecified cerebrovascular disease: Secondary | ICD-10-CM | POA: Diagnosis not present

## 2012-04-20 DIAGNOSIS — M549 Dorsalgia, unspecified: Secondary | ICD-10-CM | POA: Diagnosis not present

## 2012-04-20 DIAGNOSIS — Z7901 Long term (current) use of anticoagulants: Secondary | ICD-10-CM | POA: Diagnosis not present

## 2012-04-20 DIAGNOSIS — N390 Urinary tract infection, site not specified: Secondary | ICD-10-CM | POA: Diagnosis not present

## 2012-04-24 DIAGNOSIS — G40909 Epilepsy, unspecified, not intractable, without status epilepticus: Secondary | ICD-10-CM | POA: Diagnosis not present

## 2012-04-25 DIAGNOSIS — I69921 Dysphasia following unspecified cerebrovascular disease: Secondary | ICD-10-CM | POA: Diagnosis not present

## 2012-04-25 DIAGNOSIS — M79609 Pain in unspecified limb: Secondary | ICD-10-CM | POA: Diagnosis not present

## 2012-04-25 DIAGNOSIS — R269 Unspecified abnormalities of gait and mobility: Secondary | ICD-10-CM | POA: Diagnosis not present

## 2012-04-25 DIAGNOSIS — G603 Idiopathic progressive neuropathy: Secondary | ICD-10-CM | POA: Diagnosis not present

## 2012-04-25 DIAGNOSIS — M6281 Muscle weakness (generalized): Secondary | ICD-10-CM | POA: Diagnosis not present

## 2012-04-25 DIAGNOSIS — M25559 Pain in unspecified hip: Secondary | ICD-10-CM | POA: Diagnosis not present

## 2012-04-25 DIAGNOSIS — Z5189 Encounter for other specified aftercare: Secondary | ICD-10-CM | POA: Diagnosis not present

## 2012-04-25 DIAGNOSIS — M545 Low back pain: Secondary | ICD-10-CM | POA: Diagnosis not present

## 2012-04-27 DIAGNOSIS — I69921 Dysphasia following unspecified cerebrovascular disease: Secondary | ICD-10-CM | POA: Diagnosis not present

## 2012-04-27 DIAGNOSIS — M25559 Pain in unspecified hip: Secondary | ICD-10-CM | POA: Diagnosis not present

## 2012-04-27 DIAGNOSIS — M6281 Muscle weakness (generalized): Secondary | ICD-10-CM | POA: Diagnosis not present

## 2012-04-27 DIAGNOSIS — M79609 Pain in unspecified limb: Secondary | ICD-10-CM | POA: Diagnosis not present

## 2012-04-27 DIAGNOSIS — M545 Low back pain: Secondary | ICD-10-CM | POA: Diagnosis not present

## 2012-04-27 DIAGNOSIS — Z7901 Long term (current) use of anticoagulants: Secondary | ICD-10-CM | POA: Diagnosis not present

## 2012-04-27 DIAGNOSIS — Z5189 Encounter for other specified aftercare: Secondary | ICD-10-CM | POA: Diagnosis not present

## 2012-05-01 DIAGNOSIS — I69921 Dysphasia following unspecified cerebrovascular disease: Secondary | ICD-10-CM | POA: Diagnosis not present

## 2012-05-01 DIAGNOSIS — Z5189 Encounter for other specified aftercare: Secondary | ICD-10-CM | POA: Diagnosis not present

## 2012-05-01 DIAGNOSIS — M6281 Muscle weakness (generalized): Secondary | ICD-10-CM | POA: Diagnosis not present

## 2012-05-01 DIAGNOSIS — M25559 Pain in unspecified hip: Secondary | ICD-10-CM | POA: Diagnosis not present

## 2012-05-01 DIAGNOSIS — M545 Low back pain: Secondary | ICD-10-CM | POA: Diagnosis not present

## 2012-05-01 DIAGNOSIS — M79609 Pain in unspecified limb: Secondary | ICD-10-CM | POA: Diagnosis not present

## 2012-05-02 DIAGNOSIS — M533 Sacrococcygeal disorders, not elsewhere classified: Secondary | ICD-10-CM | POA: Diagnosis not present

## 2012-05-03 DIAGNOSIS — M25559 Pain in unspecified hip: Secondary | ICD-10-CM | POA: Diagnosis not present

## 2012-05-03 DIAGNOSIS — M545 Low back pain: Secondary | ICD-10-CM | POA: Diagnosis not present

## 2012-05-03 DIAGNOSIS — Z5189 Encounter for other specified aftercare: Secondary | ICD-10-CM | POA: Diagnosis not present

## 2012-05-03 DIAGNOSIS — I69921 Dysphasia following unspecified cerebrovascular disease: Secondary | ICD-10-CM | POA: Diagnosis not present

## 2012-05-03 DIAGNOSIS — M79609 Pain in unspecified limb: Secondary | ICD-10-CM | POA: Diagnosis not present

## 2012-05-03 DIAGNOSIS — M6281 Muscle weakness (generalized): Secondary | ICD-10-CM | POA: Diagnosis not present

## 2012-05-05 DIAGNOSIS — Z5189 Encounter for other specified aftercare: Secondary | ICD-10-CM | POA: Diagnosis not present

## 2012-05-05 DIAGNOSIS — I69921 Dysphasia following unspecified cerebrovascular disease: Secondary | ICD-10-CM | POA: Diagnosis not present

## 2012-05-05 DIAGNOSIS — M79609 Pain in unspecified limb: Secondary | ICD-10-CM | POA: Diagnosis not present

## 2012-05-05 DIAGNOSIS — M25559 Pain in unspecified hip: Secondary | ICD-10-CM | POA: Diagnosis not present

## 2012-05-05 DIAGNOSIS — M6281 Muscle weakness (generalized): Secondary | ICD-10-CM | POA: Diagnosis not present

## 2012-05-05 DIAGNOSIS — M545 Low back pain: Secondary | ICD-10-CM | POA: Diagnosis not present

## 2012-05-08 DIAGNOSIS — Z5189 Encounter for other specified aftercare: Secondary | ICD-10-CM | POA: Diagnosis not present

## 2012-05-08 DIAGNOSIS — M79609 Pain in unspecified limb: Secondary | ICD-10-CM | POA: Diagnosis not present

## 2012-05-08 DIAGNOSIS — I69921 Dysphasia following unspecified cerebrovascular disease: Secondary | ICD-10-CM | POA: Diagnosis not present

## 2012-05-08 DIAGNOSIS — M25559 Pain in unspecified hip: Secondary | ICD-10-CM | POA: Diagnosis not present

## 2012-05-08 DIAGNOSIS — M6281 Muscle weakness (generalized): Secondary | ICD-10-CM | POA: Diagnosis not present

## 2012-05-08 DIAGNOSIS — M545 Low back pain: Secondary | ICD-10-CM | POA: Diagnosis not present

## 2012-05-10 DIAGNOSIS — M6281 Muscle weakness (generalized): Secondary | ICD-10-CM | POA: Diagnosis not present

## 2012-05-10 DIAGNOSIS — M545 Low back pain: Secondary | ICD-10-CM | POA: Diagnosis not present

## 2012-05-10 DIAGNOSIS — I69921 Dysphasia following unspecified cerebrovascular disease: Secondary | ICD-10-CM | POA: Diagnosis not present

## 2012-05-10 DIAGNOSIS — M79609 Pain in unspecified limb: Secondary | ICD-10-CM | POA: Diagnosis not present

## 2012-05-10 DIAGNOSIS — M25559 Pain in unspecified hip: Secondary | ICD-10-CM | POA: Diagnosis not present

## 2012-05-10 DIAGNOSIS — Z5189 Encounter for other specified aftercare: Secondary | ICD-10-CM | POA: Diagnosis not present

## 2012-05-11 DIAGNOSIS — Z79899 Other long term (current) drug therapy: Secondary | ICD-10-CM | POA: Diagnosis not present

## 2012-05-11 DIAGNOSIS — Z7901 Long term (current) use of anticoagulants: Secondary | ICD-10-CM | POA: Diagnosis not present

## 2012-05-12 DIAGNOSIS — M79609 Pain in unspecified limb: Secondary | ICD-10-CM | POA: Diagnosis not present

## 2012-05-12 DIAGNOSIS — Z5189 Encounter for other specified aftercare: Secondary | ICD-10-CM | POA: Diagnosis not present

## 2012-05-12 DIAGNOSIS — M25559 Pain in unspecified hip: Secondary | ICD-10-CM | POA: Diagnosis not present

## 2012-05-12 DIAGNOSIS — M6281 Muscle weakness (generalized): Secondary | ICD-10-CM | POA: Diagnosis not present

## 2012-05-12 DIAGNOSIS — I69921 Dysphasia following unspecified cerebrovascular disease: Secondary | ICD-10-CM | POA: Diagnosis not present

## 2012-05-12 DIAGNOSIS — M545 Low back pain: Secondary | ICD-10-CM | POA: Diagnosis not present

## 2012-05-15 DIAGNOSIS — Z5189 Encounter for other specified aftercare: Secondary | ICD-10-CM | POA: Diagnosis not present

## 2012-05-15 DIAGNOSIS — M25559 Pain in unspecified hip: Secondary | ICD-10-CM | POA: Diagnosis not present

## 2012-05-15 DIAGNOSIS — M6281 Muscle weakness (generalized): Secondary | ICD-10-CM | POA: Diagnosis not present

## 2012-05-15 DIAGNOSIS — M79609 Pain in unspecified limb: Secondary | ICD-10-CM | POA: Diagnosis not present

## 2012-05-15 DIAGNOSIS — I69921 Dysphasia following unspecified cerebrovascular disease: Secondary | ICD-10-CM | POA: Diagnosis not present

## 2012-05-15 DIAGNOSIS — M545 Low back pain: Secondary | ICD-10-CM | POA: Diagnosis not present

## 2012-05-22 DIAGNOSIS — I69921 Dysphasia following unspecified cerebrovascular disease: Secondary | ICD-10-CM | POA: Diagnosis not present

## 2012-05-22 DIAGNOSIS — M25559 Pain in unspecified hip: Secondary | ICD-10-CM | POA: Diagnosis not present

## 2012-05-22 DIAGNOSIS — M545 Low back pain: Secondary | ICD-10-CM | POA: Diagnosis not present

## 2012-05-22 DIAGNOSIS — M79609 Pain in unspecified limb: Secondary | ICD-10-CM | POA: Diagnosis not present

## 2012-05-22 DIAGNOSIS — M6281 Muscle weakness (generalized): Secondary | ICD-10-CM | POA: Diagnosis not present

## 2012-05-22 DIAGNOSIS — Z5189 Encounter for other specified aftercare: Secondary | ICD-10-CM | POA: Diagnosis not present

## 2012-05-24 DIAGNOSIS — M6281 Muscle weakness (generalized): Secondary | ICD-10-CM | POA: Diagnosis not present

## 2012-05-24 DIAGNOSIS — M79609 Pain in unspecified limb: Secondary | ICD-10-CM | POA: Diagnosis not present

## 2012-05-24 DIAGNOSIS — M25559 Pain in unspecified hip: Secondary | ICD-10-CM | POA: Diagnosis not present

## 2012-05-24 DIAGNOSIS — M545 Low back pain: Secondary | ICD-10-CM | POA: Diagnosis not present

## 2012-05-24 DIAGNOSIS — I69921 Dysphasia following unspecified cerebrovascular disease: Secondary | ICD-10-CM | POA: Diagnosis not present

## 2012-05-24 DIAGNOSIS — Z5189 Encounter for other specified aftercare: Secondary | ICD-10-CM | POA: Diagnosis not present

## 2012-05-25 DIAGNOSIS — Z5181 Encounter for therapeutic drug level monitoring: Secondary | ICD-10-CM | POA: Diagnosis not present

## 2012-05-25 DIAGNOSIS — Z79899 Other long term (current) drug therapy: Secondary | ICD-10-CM | POA: Diagnosis not present

## 2012-05-25 DIAGNOSIS — G894 Chronic pain syndrome: Secondary | ICD-10-CM | POA: Diagnosis not present

## 2012-05-25 DIAGNOSIS — M5137 Other intervertebral disc degeneration, lumbosacral region: Secondary | ICD-10-CM | POA: Diagnosis not present

## 2012-05-29 DIAGNOSIS — Z5189 Encounter for other specified aftercare: Secondary | ICD-10-CM | POA: Diagnosis not present

## 2012-05-29 DIAGNOSIS — G603 Idiopathic progressive neuropathy: Secondary | ICD-10-CM | POA: Diagnosis not present

## 2012-05-29 DIAGNOSIS — M545 Low back pain: Secondary | ICD-10-CM | POA: Diagnosis not present

## 2012-05-29 DIAGNOSIS — M25559 Pain in unspecified hip: Secondary | ICD-10-CM | POA: Diagnosis not present

## 2012-05-29 DIAGNOSIS — M79609 Pain in unspecified limb: Secondary | ICD-10-CM | POA: Diagnosis not present

## 2012-05-29 DIAGNOSIS — R269 Unspecified abnormalities of gait and mobility: Secondary | ICD-10-CM | POA: Diagnosis not present

## 2012-05-29 DIAGNOSIS — I69921 Dysphasia following unspecified cerebrovascular disease: Secondary | ICD-10-CM | POA: Diagnosis not present

## 2012-05-29 DIAGNOSIS — M6281 Muscle weakness (generalized): Secondary | ICD-10-CM | POA: Diagnosis not present

## 2012-05-31 DIAGNOSIS — I2510 Atherosclerotic heart disease of native coronary artery without angina pectoris: Secondary | ICD-10-CM | POA: Diagnosis not present

## 2012-05-31 DIAGNOSIS — G40909 Epilepsy, unspecified, not intractable, without status epilepticus: Secondary | ICD-10-CM | POA: Diagnosis not present

## 2012-05-31 DIAGNOSIS — Z8719 Personal history of other diseases of the digestive system: Secondary | ICD-10-CM | POA: Diagnosis not present

## 2012-05-31 DIAGNOSIS — E785 Hyperlipidemia, unspecified: Secondary | ICD-10-CM | POA: Diagnosis not present

## 2012-05-31 DIAGNOSIS — E875 Hyperkalemia: Secondary | ICD-10-CM | POA: Diagnosis not present

## 2012-05-31 DIAGNOSIS — Z8669 Personal history of other diseases of the nervous system and sense organs: Secondary | ICD-10-CM | POA: Diagnosis not present

## 2012-05-31 DIAGNOSIS — I80299 Phlebitis and thrombophlebitis of other deep vessels of unspecified lower extremity: Secondary | ICD-10-CM | POA: Diagnosis not present

## 2012-05-31 DIAGNOSIS — Z87448 Personal history of other diseases of urinary system: Secondary | ICD-10-CM | POA: Diagnosis not present

## 2012-06-02 DIAGNOSIS — M25559 Pain in unspecified hip: Secondary | ICD-10-CM | POA: Diagnosis not present

## 2012-06-02 DIAGNOSIS — Z5189 Encounter for other specified aftercare: Secondary | ICD-10-CM | POA: Diagnosis not present

## 2012-06-02 DIAGNOSIS — M545 Low back pain: Secondary | ICD-10-CM | POA: Diagnosis not present

## 2012-06-02 DIAGNOSIS — I69921 Dysphasia following unspecified cerebrovascular disease: Secondary | ICD-10-CM | POA: Diagnosis not present

## 2012-06-02 DIAGNOSIS — M6281 Muscle weakness (generalized): Secondary | ICD-10-CM | POA: Diagnosis not present

## 2012-06-02 DIAGNOSIS — M79609 Pain in unspecified limb: Secondary | ICD-10-CM | POA: Diagnosis not present

## 2012-06-05 DIAGNOSIS — M545 Low back pain: Secondary | ICD-10-CM | POA: Diagnosis not present

## 2012-06-05 DIAGNOSIS — M79609 Pain in unspecified limb: Secondary | ICD-10-CM | POA: Diagnosis not present

## 2012-06-05 DIAGNOSIS — M25559 Pain in unspecified hip: Secondary | ICD-10-CM | POA: Diagnosis not present

## 2012-06-05 DIAGNOSIS — M6281 Muscle weakness (generalized): Secondary | ICD-10-CM | POA: Diagnosis not present

## 2012-06-05 DIAGNOSIS — I69921 Dysphasia following unspecified cerebrovascular disease: Secondary | ICD-10-CM | POA: Diagnosis not present

## 2012-06-05 DIAGNOSIS — Z5189 Encounter for other specified aftercare: Secondary | ICD-10-CM | POA: Diagnosis not present

## 2012-06-08 DIAGNOSIS — G936 Cerebral edema: Secondary | ICD-10-CM | POA: Diagnosis not present

## 2012-06-08 DIAGNOSIS — Z7901 Long term (current) use of anticoagulants: Secondary | ICD-10-CM | POA: Diagnosis not present

## 2012-06-12 DIAGNOSIS — M25559 Pain in unspecified hip: Secondary | ICD-10-CM | POA: Diagnosis not present

## 2012-06-12 DIAGNOSIS — M6281 Muscle weakness (generalized): Secondary | ICD-10-CM | POA: Diagnosis not present

## 2012-06-12 DIAGNOSIS — M79609 Pain in unspecified limb: Secondary | ICD-10-CM | POA: Diagnosis not present

## 2012-06-12 DIAGNOSIS — M545 Low back pain: Secondary | ICD-10-CM | POA: Diagnosis not present

## 2012-06-12 DIAGNOSIS — I69921 Dysphasia following unspecified cerebrovascular disease: Secondary | ICD-10-CM | POA: Diagnosis not present

## 2012-06-12 DIAGNOSIS — Z5189 Encounter for other specified aftercare: Secondary | ICD-10-CM | POA: Diagnosis not present

## 2012-06-16 DIAGNOSIS — M6281 Muscle weakness (generalized): Secondary | ICD-10-CM | POA: Diagnosis not present

## 2012-06-16 DIAGNOSIS — M25559 Pain in unspecified hip: Secondary | ICD-10-CM | POA: Diagnosis not present

## 2012-06-16 DIAGNOSIS — I69921 Dysphasia following unspecified cerebrovascular disease: Secondary | ICD-10-CM | POA: Diagnosis not present

## 2012-06-16 DIAGNOSIS — M79609 Pain in unspecified limb: Secondary | ICD-10-CM | POA: Diagnosis not present

## 2012-06-16 DIAGNOSIS — Z5189 Encounter for other specified aftercare: Secondary | ICD-10-CM | POA: Diagnosis not present

## 2012-06-16 DIAGNOSIS — M545 Low back pain: Secondary | ICD-10-CM | POA: Diagnosis not present

## 2012-06-19 DIAGNOSIS — G894 Chronic pain syndrome: Secondary | ICD-10-CM | POA: Diagnosis not present

## 2012-06-19 DIAGNOSIS — M961 Postlaminectomy syndrome, not elsewhere classified: Secondary | ICD-10-CM | POA: Diagnosis not present

## 2012-06-19 DIAGNOSIS — M533 Sacrococcygeal disorders, not elsewhere classified: Secondary | ICD-10-CM | POA: Diagnosis not present

## 2012-06-30 DIAGNOSIS — I69921 Dysphasia following unspecified cerebrovascular disease: Secondary | ICD-10-CM | POA: Diagnosis not present

## 2012-06-30 DIAGNOSIS — M6281 Muscle weakness (generalized): Secondary | ICD-10-CM | POA: Diagnosis not present

## 2012-06-30 DIAGNOSIS — Z5189 Encounter for other specified aftercare: Secondary | ICD-10-CM | POA: Diagnosis not present

## 2012-06-30 DIAGNOSIS — M79609 Pain in unspecified limb: Secondary | ICD-10-CM | POA: Diagnosis not present

## 2012-06-30 DIAGNOSIS — G603 Idiopathic progressive neuropathy: Secondary | ICD-10-CM | POA: Diagnosis not present

## 2012-06-30 DIAGNOSIS — R269 Unspecified abnormalities of gait and mobility: Secondary | ICD-10-CM | POA: Diagnosis not present

## 2012-06-30 DIAGNOSIS — M25559 Pain in unspecified hip: Secondary | ICD-10-CM | POA: Diagnosis not present

## 2012-06-30 DIAGNOSIS — M545 Low back pain: Secondary | ICD-10-CM | POA: Diagnosis not present

## 2012-07-06 DIAGNOSIS — Z7901 Long term (current) use of anticoagulants: Secondary | ICD-10-CM | POA: Diagnosis not present

## 2012-07-07 DIAGNOSIS — M6281 Muscle weakness (generalized): Secondary | ICD-10-CM | POA: Diagnosis not present

## 2012-07-07 DIAGNOSIS — M545 Low back pain: Secondary | ICD-10-CM | POA: Diagnosis not present

## 2012-07-07 DIAGNOSIS — I69921 Dysphasia following unspecified cerebrovascular disease: Secondary | ICD-10-CM | POA: Diagnosis not present

## 2012-07-07 DIAGNOSIS — M25559 Pain in unspecified hip: Secondary | ICD-10-CM | POA: Diagnosis not present

## 2012-07-07 DIAGNOSIS — M79609 Pain in unspecified limb: Secondary | ICD-10-CM | POA: Diagnosis not present

## 2012-07-07 DIAGNOSIS — Z5189 Encounter for other specified aftercare: Secondary | ICD-10-CM | POA: Diagnosis not present

## 2012-07-13 DIAGNOSIS — M539 Dorsopathy, unspecified: Secondary | ICD-10-CM | POA: Diagnosis not present

## 2012-07-13 DIAGNOSIS — M545 Low back pain: Secondary | ICD-10-CM | POA: Diagnosis not present

## 2012-07-13 DIAGNOSIS — M533 Sacrococcygeal disorders, not elsewhere classified: Secondary | ICD-10-CM | POA: Diagnosis not present

## 2012-07-14 DIAGNOSIS — Z5189 Encounter for other specified aftercare: Secondary | ICD-10-CM | POA: Diagnosis not present

## 2012-07-14 DIAGNOSIS — M79609 Pain in unspecified limb: Secondary | ICD-10-CM | POA: Diagnosis not present

## 2012-07-14 DIAGNOSIS — M545 Low back pain: Secondary | ICD-10-CM | POA: Diagnosis not present

## 2012-07-14 DIAGNOSIS — M6281 Muscle weakness (generalized): Secondary | ICD-10-CM | POA: Diagnosis not present

## 2012-07-14 DIAGNOSIS — M25559 Pain in unspecified hip: Secondary | ICD-10-CM | POA: Diagnosis not present

## 2012-07-14 DIAGNOSIS — I69921 Dysphasia following unspecified cerebrovascular disease: Secondary | ICD-10-CM | POA: Diagnosis not present

## 2012-07-21 DIAGNOSIS — Z7901 Long term (current) use of anticoagulants: Secondary | ICD-10-CM | POA: Diagnosis not present

## 2012-07-28 DIAGNOSIS — I69921 Dysphasia following unspecified cerebrovascular disease: Secondary | ICD-10-CM | POA: Diagnosis not present

## 2012-07-28 DIAGNOSIS — R269 Unspecified abnormalities of gait and mobility: Secondary | ICD-10-CM | POA: Diagnosis not present

## 2012-07-28 DIAGNOSIS — M25559 Pain in unspecified hip: Secondary | ICD-10-CM | POA: Diagnosis not present

## 2012-07-28 DIAGNOSIS — G603 Idiopathic progressive neuropathy: Secondary | ICD-10-CM | POA: Diagnosis not present

## 2012-07-28 DIAGNOSIS — M79609 Pain in unspecified limb: Secondary | ICD-10-CM | POA: Diagnosis not present

## 2012-07-28 DIAGNOSIS — Z5189 Encounter for other specified aftercare: Secondary | ICD-10-CM | POA: Diagnosis not present

## 2012-07-28 DIAGNOSIS — M6281 Muscle weakness (generalized): Secondary | ICD-10-CM | POA: Diagnosis not present

## 2012-07-28 DIAGNOSIS — M545 Low back pain: Secondary | ICD-10-CM | POA: Diagnosis not present

## 2012-07-31 DIAGNOSIS — Z7901 Long term (current) use of anticoagulants: Secondary | ICD-10-CM | POA: Diagnosis not present

## 2012-08-01 DIAGNOSIS — H2589 Other age-related cataract: Secondary | ICD-10-CM | POA: Diagnosis not present

## 2012-08-04 DIAGNOSIS — M6281 Muscle weakness (generalized): Secondary | ICD-10-CM | POA: Diagnosis not present

## 2012-08-04 DIAGNOSIS — Z5189 Encounter for other specified aftercare: Secondary | ICD-10-CM | POA: Diagnosis not present

## 2012-08-04 DIAGNOSIS — M25559 Pain in unspecified hip: Secondary | ICD-10-CM | POA: Diagnosis not present

## 2012-08-04 DIAGNOSIS — M79609 Pain in unspecified limb: Secondary | ICD-10-CM | POA: Diagnosis not present

## 2012-08-04 DIAGNOSIS — I69921 Dysphasia following unspecified cerebrovascular disease: Secondary | ICD-10-CM | POA: Diagnosis not present

## 2012-08-04 DIAGNOSIS — M545 Low back pain: Secondary | ICD-10-CM | POA: Diagnosis not present

## 2012-08-14 DIAGNOSIS — Z7901 Long term (current) use of anticoagulants: Secondary | ICD-10-CM | POA: Diagnosis not present

## 2012-08-14 DIAGNOSIS — Z79899 Other long term (current) drug therapy: Secondary | ICD-10-CM | POA: Diagnosis not present

## 2012-08-23 DIAGNOSIS — M545 Low back pain, unspecified: Secondary | ICD-10-CM | POA: Diagnosis not present

## 2012-08-23 DIAGNOSIS — G8929 Other chronic pain: Secondary | ICD-10-CM | POA: Diagnosis not present

## 2012-08-23 DIAGNOSIS — M129 Arthropathy, unspecified: Secondary | ICD-10-CM | POA: Diagnosis not present

## 2012-08-28 DIAGNOSIS — Z7901 Long term (current) use of anticoagulants: Secondary | ICD-10-CM | POA: Diagnosis not present

## 2012-08-29 DIAGNOSIS — H2589 Other age-related cataract: Secondary | ICD-10-CM | POA: Diagnosis not present

## 2012-09-13 DIAGNOSIS — G8929 Other chronic pain: Secondary | ICD-10-CM | POA: Diagnosis not present

## 2012-09-13 DIAGNOSIS — M129 Arthropathy, unspecified: Secondary | ICD-10-CM | POA: Diagnosis not present

## 2012-09-13 DIAGNOSIS — M545 Low back pain, unspecified: Secondary | ICD-10-CM | POA: Diagnosis not present

## 2012-09-18 DIAGNOSIS — H52209 Unspecified astigmatism, unspecified eye: Secondary | ICD-10-CM | POA: Diagnosis not present

## 2012-09-20 DIAGNOSIS — M549 Dorsalgia, unspecified: Secondary | ICD-10-CM | POA: Diagnosis not present

## 2012-09-20 DIAGNOSIS — G8929 Other chronic pain: Secondary | ICD-10-CM | POA: Diagnosis not present

## 2012-09-20 DIAGNOSIS — G40909 Epilepsy, unspecified, not intractable, without status epilepticus: Secondary | ICD-10-CM | POA: Diagnosis not present

## 2012-09-20 DIAGNOSIS — G6181 Chronic inflammatory demyelinating polyneuritis: Secondary | ICD-10-CM | POA: Diagnosis not present

## 2012-09-20 DIAGNOSIS — G936 Cerebral edema: Secondary | ICD-10-CM | POA: Diagnosis not present

## 2012-09-25 DIAGNOSIS — Z7901 Long term (current) use of anticoagulants: Secondary | ICD-10-CM | POA: Diagnosis not present

## 2012-10-02 DIAGNOSIS — H2510 Age-related nuclear cataract, unspecified eye: Secondary | ICD-10-CM | POA: Diagnosis not present

## 2012-10-16 DIAGNOSIS — H04129 Dry eye syndrome of unspecified lacrimal gland: Secondary | ICD-10-CM | POA: Diagnosis not present

## 2012-10-17 DIAGNOSIS — I10 Essential (primary) hypertension: Secondary | ICD-10-CM | POA: Diagnosis not present

## 2012-10-17 DIAGNOSIS — M545 Low back pain, unspecified: Secondary | ICD-10-CM | POA: Diagnosis not present

## 2012-10-17 DIAGNOSIS — M533 Sacrococcygeal disorders, not elsewhere classified: Secondary | ICD-10-CM | POA: Diagnosis not present

## 2012-10-17 DIAGNOSIS — Z87891 Personal history of nicotine dependence: Secondary | ICD-10-CM | POA: Diagnosis not present

## 2012-10-17 DIAGNOSIS — Z79899 Other long term (current) drug therapy: Secondary | ICD-10-CM | POA: Diagnosis not present

## 2012-10-17 DIAGNOSIS — Z7901 Long term (current) use of anticoagulants: Secondary | ICD-10-CM | POA: Diagnosis not present

## 2012-10-17 DIAGNOSIS — M47817 Spondylosis without myelopathy or radiculopathy, lumbosacral region: Secondary | ICD-10-CM | POA: Diagnosis not present

## 2012-10-17 DIAGNOSIS — M961 Postlaminectomy syndrome, not elsewhere classified: Secondary | ICD-10-CM | POA: Diagnosis not present

## 2012-10-17 DIAGNOSIS — M129 Arthropathy, unspecified: Secondary | ICD-10-CM | POA: Diagnosis not present

## 2012-10-18 DIAGNOSIS — N390 Urinary tract infection, site not specified: Secondary | ICD-10-CM | POA: Diagnosis not present

## 2012-10-24 DIAGNOSIS — Z79899 Other long term (current) drug therapy: Secondary | ICD-10-CM | POA: Diagnosis not present

## 2012-10-24 DIAGNOSIS — M533 Sacrococcygeal disorders, not elsewhere classified: Secondary | ICD-10-CM | POA: Diagnosis not present

## 2012-10-24 DIAGNOSIS — M961 Postlaminectomy syndrome, not elsewhere classified: Secondary | ICD-10-CM | POA: Diagnosis not present

## 2012-10-24 DIAGNOSIS — E785 Hyperlipidemia, unspecified: Secondary | ICD-10-CM | POA: Diagnosis not present

## 2012-10-24 DIAGNOSIS — M545 Low back pain, unspecified: Secondary | ICD-10-CM | POA: Diagnosis not present

## 2012-10-24 DIAGNOSIS — Z7901 Long term (current) use of anticoagulants: Secondary | ICD-10-CM | POA: Diagnosis not present

## 2012-10-24 DIAGNOSIS — G894 Chronic pain syndrome: Secondary | ICD-10-CM | POA: Diagnosis not present

## 2012-10-24 DIAGNOSIS — Z87891 Personal history of nicotine dependence: Secondary | ICD-10-CM | POA: Diagnosis not present

## 2012-10-24 DIAGNOSIS — E039 Hypothyroidism, unspecified: Secondary | ICD-10-CM | POA: Diagnosis not present

## 2012-10-24 DIAGNOSIS — M79609 Pain in unspecified limb: Secondary | ICD-10-CM | POA: Diagnosis not present

## 2012-10-24 DIAGNOSIS — K219 Gastro-esophageal reflux disease without esophagitis: Secondary | ICD-10-CM | POA: Diagnosis not present

## 2012-10-24 DIAGNOSIS — M5380 Other specified dorsopathies, site unspecified: Secondary | ICD-10-CM | POA: Diagnosis not present

## 2012-11-06 DIAGNOSIS — Z7901 Long term (current) use of anticoagulants: Secondary | ICD-10-CM | POA: Diagnosis not present

## 2012-11-09 DIAGNOSIS — G894 Chronic pain syndrome: Secondary | ICD-10-CM

## 2012-11-09 DIAGNOSIS — M533 Sacrococcygeal disorders, not elsewhere classified: Secondary | ICD-10-CM | POA: Diagnosis not present

## 2012-11-09 DIAGNOSIS — M539 Dorsopathy, unspecified: Secondary | ICD-10-CM | POA: Diagnosis not present

## 2012-11-09 HISTORY — DX: Chronic pain syndrome: G89.4

## 2012-11-20 DIAGNOSIS — M5137 Other intervertebral disc degeneration, lumbosacral region: Secondary | ICD-10-CM | POA: Diagnosis not present

## 2012-11-20 DIAGNOSIS — Z7901 Long term (current) use of anticoagulants: Secondary | ICD-10-CM | POA: Diagnosis not present

## 2012-11-20 DIAGNOSIS — Z981 Arthrodesis status: Secondary | ICD-10-CM | POA: Diagnosis not present

## 2012-11-21 DIAGNOSIS — E039 Hypothyroidism, unspecified: Secondary | ICD-10-CM | POA: Diagnosis not present

## 2012-11-21 DIAGNOSIS — E782 Mixed hyperlipidemia: Secondary | ICD-10-CM | POA: Diagnosis not present

## 2012-11-21 DIAGNOSIS — Z7901 Long term (current) use of anticoagulants: Secondary | ICD-10-CM | POA: Diagnosis not present

## 2012-11-22 DIAGNOSIS — M539 Dorsopathy, unspecified: Secondary | ICD-10-CM | POA: Diagnosis not present

## 2012-11-22 DIAGNOSIS — G894 Chronic pain syndrome: Secondary | ICD-10-CM | POA: Diagnosis not present

## 2012-11-22 DIAGNOSIS — M533 Sacrococcygeal disorders, not elsewhere classified: Secondary | ICD-10-CM | POA: Diagnosis not present

## 2012-11-22 DIAGNOSIS — IMO0002 Reserved for concepts with insufficient information to code with codable children: Secondary | ICD-10-CM | POA: Diagnosis not present

## 2012-11-27 DIAGNOSIS — E039 Hypothyroidism, unspecified: Secondary | ICD-10-CM | POA: Diagnosis not present

## 2012-11-27 DIAGNOSIS — M899 Disorder of bone, unspecified: Secondary | ICD-10-CM | POA: Diagnosis not present

## 2012-11-27 DIAGNOSIS — E782 Mixed hyperlipidemia: Secondary | ICD-10-CM | POA: Diagnosis not present

## 2012-11-27 DIAGNOSIS — Z79899 Other long term (current) drug therapy: Secondary | ICD-10-CM | POA: Diagnosis not present

## 2012-11-27 DIAGNOSIS — M949 Disorder of cartilage, unspecified: Secondary | ICD-10-CM | POA: Diagnosis not present

## 2012-12-08 DIAGNOSIS — G894 Chronic pain syndrome: Secondary | ICD-10-CM | POA: Diagnosis not present

## 2012-12-08 DIAGNOSIS — G039 Meningitis, unspecified: Secondary | ICD-10-CM

## 2012-12-08 DIAGNOSIS — M533 Sacrococcygeal disorders, not elsewhere classified: Secondary | ICD-10-CM | POA: Diagnosis not present

## 2012-12-08 DIAGNOSIS — M539 Dorsopathy, unspecified: Secondary | ICD-10-CM | POA: Diagnosis not present

## 2012-12-08 HISTORY — DX: Meningitis, unspecified: G03.9

## 2012-12-13 DIAGNOSIS — G894 Chronic pain syndrome: Secondary | ICD-10-CM | POA: Diagnosis not present

## 2012-12-13 DIAGNOSIS — Z8673 Personal history of transient ischemic attack (TIA), and cerebral infarction without residual deficits: Secondary | ICD-10-CM | POA: Diagnosis not present

## 2012-12-13 DIAGNOSIS — M539 Dorsopathy, unspecified: Secondary | ICD-10-CM | POA: Diagnosis not present

## 2012-12-13 DIAGNOSIS — R51 Headache: Secondary | ICD-10-CM | POA: Diagnosis not present

## 2012-12-13 DIAGNOSIS — M533 Sacrococcygeal disorders, not elsewhere classified: Secondary | ICD-10-CM | POA: Diagnosis not present

## 2012-12-13 DIAGNOSIS — G039 Meningitis, unspecified: Secondary | ICD-10-CM | POA: Diagnosis not present

## 2012-12-15 DIAGNOSIS — R791 Abnormal coagulation profile: Secondary | ICD-10-CM | POA: Diagnosis not present

## 2012-12-15 DIAGNOSIS — I2510 Atherosclerotic heart disease of native coronary artery without angina pectoris: Secondary | ICD-10-CM | POA: Diagnosis not present

## 2012-12-15 DIAGNOSIS — K219 Gastro-esophageal reflux disease without esophagitis: Secondary | ICD-10-CM | POA: Diagnosis not present

## 2012-12-15 DIAGNOSIS — I10 Essential (primary) hypertension: Secondary | ICD-10-CM | POA: Diagnosis not present

## 2012-12-15 DIAGNOSIS — E7800 Pure hypercholesterolemia, unspecified: Secondary | ICD-10-CM | POA: Diagnosis not present

## 2012-12-15 DIAGNOSIS — S335XXA Sprain of ligaments of lumbar spine, initial encounter: Secondary | ICD-10-CM | POA: Diagnosis not present

## 2012-12-15 DIAGNOSIS — S40029A Contusion of unspecified upper arm, initial encounter: Secondary | ICD-10-CM | POA: Diagnosis not present

## 2012-12-15 DIAGNOSIS — T148XXA Other injury of unspecified body region, initial encounter: Secondary | ICD-10-CM | POA: Diagnosis not present

## 2012-12-15 DIAGNOSIS — X58XXXA Exposure to other specified factors, initial encounter: Secondary | ICD-10-CM | POA: Diagnosis not present

## 2012-12-15 DIAGNOSIS — Z7901 Long term (current) use of anticoagulants: Secondary | ICD-10-CM | POA: Diagnosis not present

## 2012-12-15 DIAGNOSIS — Z96649 Presence of unspecified artificial hip joint: Secondary | ICD-10-CM | POA: Diagnosis not present

## 2012-12-16 DIAGNOSIS — I2510 Atherosclerotic heart disease of native coronary artery without angina pectoris: Secondary | ICD-10-CM | POA: Diagnosis not present

## 2012-12-16 DIAGNOSIS — E7800 Pure hypercholesterolemia, unspecified: Secondary | ICD-10-CM | POA: Diagnosis not present

## 2012-12-16 DIAGNOSIS — R791 Abnormal coagulation profile: Secondary | ICD-10-CM | POA: Diagnosis not present

## 2012-12-16 DIAGNOSIS — S335XXA Sprain of ligaments of lumbar spine, initial encounter: Secondary | ICD-10-CM | POA: Diagnosis not present

## 2012-12-16 DIAGNOSIS — Z79899 Other long term (current) drug therapy: Secondary | ICD-10-CM | POA: Diagnosis not present

## 2012-12-16 DIAGNOSIS — Z7901 Long term (current) use of anticoagulants: Secondary | ICD-10-CM | POA: Diagnosis not present

## 2012-12-16 DIAGNOSIS — K5730 Diverticulosis of large intestine without perforation or abscess without bleeding: Secondary | ICD-10-CM | POA: Diagnosis not present

## 2012-12-16 DIAGNOSIS — X58XXXA Exposure to other specified factors, initial encounter: Secondary | ICD-10-CM | POA: Diagnosis not present

## 2012-12-16 DIAGNOSIS — K219 Gastro-esophageal reflux disease without esophagitis: Secondary | ICD-10-CM | POA: Diagnosis not present

## 2012-12-16 DIAGNOSIS — T148XXA Other injury of unspecified body region, initial encounter: Secondary | ICD-10-CM | POA: Diagnosis not present

## 2012-12-16 DIAGNOSIS — J438 Other emphysema: Secondary | ICD-10-CM | POA: Diagnosis not present

## 2012-12-16 DIAGNOSIS — Z96649 Presence of unspecified artificial hip joint: Secondary | ICD-10-CM | POA: Diagnosis not present

## 2012-12-16 DIAGNOSIS — S40029A Contusion of unspecified upper arm, initial encounter: Secondary | ICD-10-CM | POA: Diagnosis not present

## 2012-12-16 DIAGNOSIS — I10 Essential (primary) hypertension: Secondary | ICD-10-CM | POA: Diagnosis not present

## 2012-12-16 DIAGNOSIS — M545 Low back pain: Secondary | ICD-10-CM | POA: Diagnosis not present

## 2012-12-20 DIAGNOSIS — G8929 Other chronic pain: Secondary | ICD-10-CM | POA: Diagnosis not present

## 2012-12-20 DIAGNOSIS — Z7901 Long term (current) use of anticoagulants: Secondary | ICD-10-CM | POA: Diagnosis not present

## 2012-12-20 DIAGNOSIS — N3000 Acute cystitis without hematuria: Secondary | ICD-10-CM | POA: Diagnosis not present

## 2012-12-20 DIAGNOSIS — M549 Dorsalgia, unspecified: Secondary | ICD-10-CM | POA: Diagnosis not present

## 2012-12-22 DIAGNOSIS — G039 Meningitis, unspecified: Secondary | ICD-10-CM | POA: Diagnosis not present

## 2012-12-22 DIAGNOSIS — M539 Dorsopathy, unspecified: Secondary | ICD-10-CM | POA: Diagnosis not present

## 2012-12-22 DIAGNOSIS — G894 Chronic pain syndrome: Secondary | ICD-10-CM | POA: Diagnosis not present

## 2012-12-30 DIAGNOSIS — M810 Age-related osteoporosis without current pathological fracture: Secondary | ICD-10-CM

## 2012-12-30 HISTORY — DX: Age-related osteoporosis without current pathological fracture: M81.0

## 2013-01-03 DIAGNOSIS — G039 Meningitis, unspecified: Secondary | ICD-10-CM | POA: Diagnosis not present

## 2013-01-03 DIAGNOSIS — Z5181 Encounter for therapeutic drug level monitoring: Secondary | ICD-10-CM | POA: Diagnosis not present

## 2013-01-03 DIAGNOSIS — M533 Sacrococcygeal disorders, not elsewhere classified: Secondary | ICD-10-CM | POA: Diagnosis not present

## 2013-01-03 DIAGNOSIS — M539 Dorsopathy, unspecified: Secondary | ICD-10-CM | POA: Diagnosis not present

## 2013-01-03 DIAGNOSIS — Z7901 Long term (current) use of anticoagulants: Secondary | ICD-10-CM | POA: Diagnosis not present

## 2013-01-03 DIAGNOSIS — Z79899 Other long term (current) drug therapy: Secondary | ICD-10-CM | POA: Diagnosis not present

## 2013-01-03 DIAGNOSIS — G894 Chronic pain syndrome: Secondary | ICD-10-CM | POA: Diagnosis not present

## 2013-01-16 DIAGNOSIS — F4323 Adjustment disorder with mixed anxiety and depressed mood: Secondary | ICD-10-CM | POA: Diagnosis not present

## 2013-01-16 DIAGNOSIS — F4542 Pain disorder with related psychological factors: Secondary | ICD-10-CM | POA: Diagnosis not present

## 2013-01-17 DIAGNOSIS — N3000 Acute cystitis without hematuria: Secondary | ICD-10-CM | POA: Diagnosis not present

## 2013-01-17 DIAGNOSIS — Z7901 Long term (current) use of anticoagulants: Secondary | ICD-10-CM | POA: Diagnosis not present

## 2013-01-19 DIAGNOSIS — F1120 Opioid dependence, uncomplicated: Secondary | ICD-10-CM

## 2013-01-19 HISTORY — DX: Opioid dependence, uncomplicated: F11.20

## 2013-01-25 DIAGNOSIS — G894 Chronic pain syndrome: Secondary | ICD-10-CM | POA: Diagnosis not present

## 2013-01-25 DIAGNOSIS — G039 Meningitis, unspecified: Secondary | ICD-10-CM | POA: Diagnosis not present

## 2013-01-25 DIAGNOSIS — M533 Sacrococcygeal disorders, not elsewhere classified: Secondary | ICD-10-CM | POA: Diagnosis not present

## 2013-01-25 DIAGNOSIS — M961 Postlaminectomy syndrome, not elsewhere classified: Secondary | ICD-10-CM | POA: Diagnosis not present

## 2013-01-31 DIAGNOSIS — G039 Meningitis, unspecified: Secondary | ICD-10-CM | POA: Diagnosis not present

## 2013-01-31 DIAGNOSIS — M961 Postlaminectomy syndrome, not elsewhere classified: Secondary | ICD-10-CM | POA: Diagnosis not present

## 2013-01-31 DIAGNOSIS — G894 Chronic pain syndrome: Secondary | ICD-10-CM | POA: Diagnosis not present

## 2013-01-31 DIAGNOSIS — M533 Sacrococcygeal disorders, not elsewhere classified: Secondary | ICD-10-CM | POA: Diagnosis not present

## 2013-02-07 DIAGNOSIS — Z7901 Long term (current) use of anticoagulants: Secondary | ICD-10-CM | POA: Diagnosis not present

## 2013-02-07 DIAGNOSIS — N3000 Acute cystitis without hematuria: Secondary | ICD-10-CM | POA: Diagnosis not present

## 2013-02-08 DIAGNOSIS — M533 Sacrococcygeal disorders, not elsewhere classified: Secondary | ICD-10-CM | POA: Insufficient documentation

## 2013-02-08 DIAGNOSIS — L89309 Pressure ulcer of unspecified buttock, unspecified stage: Secondary | ICD-10-CM | POA: Diagnosis not present

## 2013-02-08 DIAGNOSIS — Z7901 Long term (current) use of anticoagulants: Secondary | ICD-10-CM | POA: Diagnosis not present

## 2013-02-08 DIAGNOSIS — L8990 Pressure ulcer of unspecified site, unspecified stage: Secondary | ICD-10-CM | POA: Diagnosis not present

## 2013-02-08 HISTORY — DX: Sacrococcygeal disorders, not elsewhere classified: M53.3

## 2013-02-19 DIAGNOSIS — IMO0002 Reserved for concepts with insufficient information to code with codable children: Secondary | ICD-10-CM | POA: Diagnosis not present

## 2013-02-19 DIAGNOSIS — M961 Postlaminectomy syndrome, not elsewhere classified: Secondary | ICD-10-CM | POA: Diagnosis not present

## 2013-02-19 DIAGNOSIS — G039 Meningitis, unspecified: Secondary | ICD-10-CM | POA: Diagnosis not present

## 2013-02-19 DIAGNOSIS — M533 Sacrococcygeal disorders, not elsewhere classified: Secondary | ICD-10-CM | POA: Diagnosis not present

## 2013-02-19 DIAGNOSIS — G894 Chronic pain syndrome: Secondary | ICD-10-CM | POA: Diagnosis not present

## 2013-02-20 DIAGNOSIS — G40909 Epilepsy, unspecified, not intractable, without status epilepticus: Secondary | ICD-10-CM | POA: Diagnosis not present

## 2013-02-20 DIAGNOSIS — R569 Unspecified convulsions: Secondary | ICD-10-CM | POA: Diagnosis not present

## 2013-02-20 DIAGNOSIS — G8929 Other chronic pain: Secondary | ICD-10-CM | POA: Diagnosis not present

## 2013-02-20 DIAGNOSIS — M549 Dorsalgia, unspecified: Secondary | ICD-10-CM | POA: Diagnosis not present

## 2013-02-20 DIAGNOSIS — R55 Syncope and collapse: Secondary | ICD-10-CM

## 2013-02-20 HISTORY — DX: Syncope and collapse: R55

## 2013-03-01 DIAGNOSIS — Z7901 Long term (current) use of anticoagulants: Secondary | ICD-10-CM | POA: Diagnosis not present

## 2013-03-20 DIAGNOSIS — G039 Meningitis, unspecified: Secondary | ICD-10-CM | POA: Diagnosis not present

## 2013-03-20 DIAGNOSIS — M961 Postlaminectomy syndrome, not elsewhere classified: Secondary | ICD-10-CM | POA: Diagnosis not present

## 2013-03-20 DIAGNOSIS — G894 Chronic pain syndrome: Secondary | ICD-10-CM | POA: Diagnosis not present

## 2013-03-20 DIAGNOSIS — M533 Sacrococcygeal disorders, not elsewhere classified: Secondary | ICD-10-CM | POA: Diagnosis not present

## 2013-04-09 DIAGNOSIS — M533 Sacrococcygeal disorders, not elsewhere classified: Secondary | ICD-10-CM | POA: Diagnosis not present

## 2013-04-09 DIAGNOSIS — G039 Meningitis, unspecified: Secondary | ICD-10-CM | POA: Diagnosis not present

## 2013-04-09 DIAGNOSIS — M961 Postlaminectomy syndrome, not elsewhere classified: Secondary | ICD-10-CM | POA: Diagnosis not present

## 2013-04-09 DIAGNOSIS — G894 Chronic pain syndrome: Secondary | ICD-10-CM | POA: Diagnosis not present

## 2013-04-14 DIAGNOSIS — D649 Anemia, unspecified: Secondary | ICD-10-CM | POA: Diagnosis not present

## 2013-04-14 DIAGNOSIS — E7800 Pure hypercholesterolemia, unspecified: Secondary | ICD-10-CM | POA: Diagnosis not present

## 2013-04-14 DIAGNOSIS — K219 Gastro-esophageal reflux disease without esophagitis: Secondary | ICD-10-CM | POA: Diagnosis not present

## 2013-04-14 DIAGNOSIS — R5381 Other malaise: Secondary | ICD-10-CM | POA: Diagnosis not present

## 2013-04-14 DIAGNOSIS — J189 Pneumonia, unspecified organism: Secondary | ICD-10-CM | POA: Diagnosis not present

## 2013-04-14 DIAGNOSIS — Z79899 Other long term (current) drug therapy: Secondary | ICD-10-CM | POA: Diagnosis not present

## 2013-04-14 DIAGNOSIS — I10 Essential (primary) hypertension: Secondary | ICD-10-CM | POA: Diagnosis not present

## 2013-04-14 DIAGNOSIS — I2510 Atherosclerotic heart disease of native coronary artery without angina pectoris: Secondary | ICD-10-CM | POA: Diagnosis not present

## 2013-04-14 DIAGNOSIS — R300 Dysuria: Secondary | ICD-10-CM | POA: Diagnosis not present

## 2013-04-14 DIAGNOSIS — Z7901 Long term (current) use of anticoagulants: Secondary | ICD-10-CM | POA: Diagnosis not present

## 2013-04-16 DIAGNOSIS — I10 Essential (primary) hypertension: Secondary | ICD-10-CM | POA: Diagnosis not present

## 2013-04-16 DIAGNOSIS — J189 Pneumonia, unspecified organism: Secondary | ICD-10-CM | POA: Diagnosis not present

## 2013-04-16 DIAGNOSIS — Z7901 Long term (current) use of anticoagulants: Secondary | ICD-10-CM | POA: Diagnosis not present

## 2013-04-18 DIAGNOSIS — M533 Sacrococcygeal disorders, not elsewhere classified: Secondary | ICD-10-CM | POA: Diagnosis not present

## 2013-04-18 DIAGNOSIS — M961 Postlaminectomy syndrome, not elsewhere classified: Secondary | ICD-10-CM | POA: Diagnosis not present

## 2013-04-18 DIAGNOSIS — G894 Chronic pain syndrome: Secondary | ICD-10-CM | POA: Diagnosis not present

## 2013-04-18 DIAGNOSIS — G039 Meningitis, unspecified: Secondary | ICD-10-CM | POA: Diagnosis not present

## 2013-04-20 DIAGNOSIS — N281 Cyst of kidney, acquired: Secondary | ICD-10-CM | POA: Insufficient documentation

## 2013-04-20 DIAGNOSIS — I10 Essential (primary) hypertension: Secondary | ICD-10-CM | POA: Diagnosis not present

## 2013-04-20 DIAGNOSIS — Q619 Cystic kidney disease, unspecified: Secondary | ICD-10-CM | POA: Diagnosis not present

## 2013-04-20 HISTORY — DX: Cyst of kidney, acquired: N28.1

## 2013-04-24 DIAGNOSIS — Q619 Cystic kidney disease, unspecified: Secondary | ICD-10-CM | POA: Diagnosis not present

## 2013-04-25 DIAGNOSIS — F4321 Adjustment disorder with depressed mood: Secondary | ICD-10-CM | POA: Diagnosis not present

## 2013-05-21 DIAGNOSIS — Z7901 Long term (current) use of anticoagulants: Secondary | ICD-10-CM | POA: Diagnosis not present

## 2013-05-25 DIAGNOSIS — M961 Postlaminectomy syndrome, not elsewhere classified: Secondary | ICD-10-CM | POA: Diagnosis not present

## 2013-05-25 DIAGNOSIS — M533 Sacrococcygeal disorders, not elsewhere classified: Secondary | ICD-10-CM | POA: Diagnosis not present

## 2013-05-25 DIAGNOSIS — G894 Chronic pain syndrome: Secondary | ICD-10-CM | POA: Diagnosis not present

## 2013-05-25 DIAGNOSIS — F329 Major depressive disorder, single episode, unspecified: Secondary | ICD-10-CM | POA: Diagnosis not present

## 2013-05-25 DIAGNOSIS — G039 Meningitis, unspecified: Secondary | ICD-10-CM | POA: Diagnosis not present

## 2013-06-05 DIAGNOSIS — M533 Sacrococcygeal disorders, not elsewhere classified: Secondary | ICD-10-CM | POA: Diagnosis not present

## 2013-06-05 DIAGNOSIS — G894 Chronic pain syndrome: Secondary | ICD-10-CM | POA: Diagnosis not present

## 2013-06-05 DIAGNOSIS — G039 Meningitis, unspecified: Secondary | ICD-10-CM | POA: Diagnosis not present

## 2013-06-05 DIAGNOSIS — M961 Postlaminectomy syndrome, not elsewhere classified: Secondary | ICD-10-CM | POA: Diagnosis not present

## 2013-06-08 DIAGNOSIS — R112 Nausea with vomiting, unspecified: Secondary | ICD-10-CM | POA: Diagnosis not present

## 2013-06-08 DIAGNOSIS — R1032 Left lower quadrant pain: Secondary | ICD-10-CM | POA: Diagnosis not present

## 2013-06-08 DIAGNOSIS — Z8673 Personal history of transient ischemic attack (TIA), and cerebral infarction without residual deficits: Secondary | ICD-10-CM | POA: Diagnosis not present

## 2013-06-08 DIAGNOSIS — A0472 Enterocolitis due to Clostridium difficile, not specified as recurrent: Secondary | ICD-10-CM | POA: Diagnosis not present

## 2013-06-08 DIAGNOSIS — A419 Sepsis, unspecified organism: Secondary | ICD-10-CM | POA: Diagnosis not present

## 2013-06-08 DIAGNOSIS — R569 Unspecified convulsions: Secondary | ICD-10-CM | POA: Diagnosis present

## 2013-06-08 DIAGNOSIS — R52 Pain, unspecified: Secondary | ICD-10-CM | POA: Diagnosis present

## 2013-06-08 DIAGNOSIS — K591 Functional diarrhea: Secondary | ICD-10-CM | POA: Diagnosis not present

## 2013-06-08 DIAGNOSIS — E876 Hypokalemia: Secondary | ICD-10-CM | POA: Diagnosis not present

## 2013-06-08 DIAGNOSIS — R109 Unspecified abdominal pain: Secondary | ICD-10-CM | POA: Diagnosis not present

## 2013-06-08 DIAGNOSIS — M545 Low back pain: Secondary | ICD-10-CM | POA: Diagnosis not present

## 2013-06-08 DIAGNOSIS — I739 Peripheral vascular disease, unspecified: Secondary | ICD-10-CM | POA: Diagnosis present

## 2013-06-08 DIAGNOSIS — E860 Dehydration: Secondary | ICD-10-CM | POA: Diagnosis not present

## 2013-06-08 DIAGNOSIS — K922 Gastrointestinal hemorrhage, unspecified: Secondary | ICD-10-CM | POA: Diagnosis not present

## 2013-06-08 DIAGNOSIS — E7800 Pure hypercholesterolemia, unspecified: Secondary | ICD-10-CM | POA: Diagnosis present

## 2013-06-08 DIAGNOSIS — I10 Essential (primary) hypertension: Secondary | ICD-10-CM | POA: Diagnosis not present

## 2013-06-08 DIAGNOSIS — K219 Gastro-esophageal reflux disease without esophagitis: Secondary | ICD-10-CM | POA: Diagnosis present

## 2013-06-08 DIAGNOSIS — Z79899 Other long term (current) drug therapy: Secondary | ICD-10-CM | POA: Diagnosis not present

## 2013-06-08 DIAGNOSIS — K5909 Other constipation: Secondary | ICD-10-CM | POA: Diagnosis present

## 2013-06-08 DIAGNOSIS — I4891 Unspecified atrial fibrillation: Secondary | ICD-10-CM | POA: Diagnosis not present

## 2013-06-08 DIAGNOSIS — I779 Disorder of arteries and arterioles, unspecified: Secondary | ICD-10-CM | POA: Diagnosis present

## 2013-06-08 DIAGNOSIS — R5381 Other malaise: Secondary | ICD-10-CM | POA: Diagnosis not present

## 2013-06-08 DIAGNOSIS — I2510 Atherosclerotic heart disease of native coronary artery without angina pectoris: Secondary | ICD-10-CM | POA: Diagnosis present

## 2013-06-08 DIAGNOSIS — N281 Cyst of kidney, acquired: Secondary | ICD-10-CM | POA: Diagnosis not present

## 2013-06-08 DIAGNOSIS — K5289 Other specified noninfective gastroenteritis and colitis: Secondary | ICD-10-CM | POA: Diagnosis not present

## 2013-06-08 DIAGNOSIS — K921 Melena: Secondary | ICD-10-CM | POA: Diagnosis not present

## 2013-07-02 DIAGNOSIS — M549 Dorsalgia, unspecified: Secondary | ICD-10-CM | POA: Diagnosis not present

## 2013-07-02 DIAGNOSIS — Z7901 Long term (current) use of anticoagulants: Secondary | ICD-10-CM | POA: Diagnosis not present

## 2013-07-02 DIAGNOSIS — Z09 Encounter for follow-up examination after completed treatment for conditions other than malignant neoplasm: Secondary | ICD-10-CM | POA: Diagnosis not present

## 2013-07-02 DIAGNOSIS — G8929 Other chronic pain: Secondary | ICD-10-CM | POA: Diagnosis not present

## 2013-07-03 DIAGNOSIS — M810 Age-related osteoporosis without current pathological fracture: Secondary | ICD-10-CM | POA: Diagnosis not present

## 2013-07-03 DIAGNOSIS — G608 Other hereditary and idiopathic neuropathies: Secondary | ICD-10-CM | POA: Diagnosis not present

## 2013-07-03 DIAGNOSIS — E039 Hypothyroidism, unspecified: Secondary | ICD-10-CM | POA: Diagnosis not present

## 2013-07-20 DIAGNOSIS — G039 Meningitis, unspecified: Secondary | ICD-10-CM | POA: Diagnosis not present

## 2013-07-20 DIAGNOSIS — G894 Chronic pain syndrome: Secondary | ICD-10-CM | POA: Diagnosis not present

## 2013-07-20 DIAGNOSIS — IMO0002 Reserved for concepts with insufficient information to code with codable children: Secondary | ICD-10-CM | POA: Diagnosis not present

## 2013-07-20 DIAGNOSIS — G8929 Other chronic pain: Secondary | ICD-10-CM | POA: Diagnosis not present

## 2013-07-20 DIAGNOSIS — M961 Postlaminectomy syndrome, not elsewhere classified: Secondary | ICD-10-CM | POA: Diagnosis not present

## 2013-07-20 DIAGNOSIS — G40909 Epilepsy, unspecified, not intractable, without status epilepticus: Secondary | ICD-10-CM | POA: Diagnosis not present

## 2013-07-20 DIAGNOSIS — M545 Low back pain: Secondary | ICD-10-CM | POA: Diagnosis not present

## 2013-07-20 DIAGNOSIS — I2510 Atherosclerotic heart disease of native coronary artery without angina pectoris: Secondary | ICD-10-CM | POA: Diagnosis not present

## 2013-07-20 DIAGNOSIS — Z86718 Personal history of other venous thrombosis and embolism: Secondary | ICD-10-CM | POA: Diagnosis not present

## 2013-08-14 DIAGNOSIS — M961 Postlaminectomy syndrome, not elsewhere classified: Secondary | ICD-10-CM | POA: Diagnosis not present

## 2013-08-14 DIAGNOSIS — G894 Chronic pain syndrome: Secondary | ICD-10-CM | POA: Diagnosis not present

## 2013-08-14 DIAGNOSIS — G039 Meningitis, unspecified: Secondary | ICD-10-CM | POA: Diagnosis not present

## 2013-08-14 DIAGNOSIS — Z79899 Other long term (current) drug therapy: Secondary | ICD-10-CM | POA: Diagnosis not present

## 2013-08-14 DIAGNOSIS — IMO0002 Reserved for concepts with insufficient information to code with codable children: Secondary | ICD-10-CM | POA: Diagnosis not present

## 2013-08-14 DIAGNOSIS — Z5181 Encounter for therapeutic drug level monitoring: Secondary | ICD-10-CM | POA: Diagnosis not present

## 2013-08-29 DIAGNOSIS — G894 Chronic pain syndrome: Secondary | ICD-10-CM | POA: Diagnosis not present

## 2013-08-29 DIAGNOSIS — M961 Postlaminectomy syndrome, not elsewhere classified: Secondary | ICD-10-CM | POA: Diagnosis not present

## 2013-08-29 DIAGNOSIS — Z79899 Other long term (current) drug therapy: Secondary | ICD-10-CM | POA: Diagnosis not present

## 2013-08-29 DIAGNOSIS — IMO0002 Reserved for concepts with insufficient information to code with codable children: Secondary | ICD-10-CM | POA: Diagnosis not present

## 2013-09-17 DIAGNOSIS — R262 Difficulty in walking, not elsewhere classified: Secondary | ICD-10-CM | POA: Diagnosis not present

## 2013-09-17 DIAGNOSIS — M6281 Muscle weakness (generalized): Secondary | ICD-10-CM | POA: Diagnosis not present

## 2013-09-17 DIAGNOSIS — IMO0001 Reserved for inherently not codable concepts without codable children: Secondary | ICD-10-CM | POA: Diagnosis not present

## 2013-09-17 DIAGNOSIS — M545 Low back pain, unspecified: Secondary | ICD-10-CM | POA: Diagnosis not present

## 2013-09-17 DIAGNOSIS — Z9181 History of falling: Secondary | ICD-10-CM | POA: Diagnosis not present

## 2013-09-22 DIAGNOSIS — M6281 Muscle weakness (generalized): Secondary | ICD-10-CM | POA: Diagnosis not present

## 2013-09-22 DIAGNOSIS — Z9181 History of falling: Secondary | ICD-10-CM | POA: Diagnosis not present

## 2013-09-22 DIAGNOSIS — M545 Low back pain, unspecified: Secondary | ICD-10-CM | POA: Diagnosis not present

## 2013-09-22 DIAGNOSIS — R262 Difficulty in walking, not elsewhere classified: Secondary | ICD-10-CM | POA: Diagnosis not present

## 2013-09-22 DIAGNOSIS — IMO0001 Reserved for inherently not codable concepts without codable children: Secondary | ICD-10-CM | POA: Diagnosis not present

## 2013-09-25 DIAGNOSIS — R262 Difficulty in walking, not elsewhere classified: Secondary | ICD-10-CM | POA: Diagnosis not present

## 2013-09-25 DIAGNOSIS — M545 Low back pain, unspecified: Secondary | ICD-10-CM | POA: Diagnosis not present

## 2013-09-25 DIAGNOSIS — Z9181 History of falling: Secondary | ICD-10-CM | POA: Diagnosis not present

## 2013-09-25 DIAGNOSIS — M6281 Muscle weakness (generalized): Secondary | ICD-10-CM | POA: Diagnosis not present

## 2013-09-25 DIAGNOSIS — IMO0001 Reserved for inherently not codable concepts without codable children: Secondary | ICD-10-CM | POA: Diagnosis not present

## 2013-09-27 DIAGNOSIS — R262 Difficulty in walking, not elsewhere classified: Secondary | ICD-10-CM | POA: Diagnosis not present

## 2013-09-27 DIAGNOSIS — IMO0001 Reserved for inherently not codable concepts without codable children: Secondary | ICD-10-CM | POA: Diagnosis not present

## 2013-09-27 DIAGNOSIS — M545 Low back pain, unspecified: Secondary | ICD-10-CM | POA: Diagnosis not present

## 2013-09-27 DIAGNOSIS — M6281 Muscle weakness (generalized): Secondary | ICD-10-CM | POA: Diagnosis not present

## 2013-09-27 DIAGNOSIS — Z9181 History of falling: Secondary | ICD-10-CM | POA: Diagnosis not present

## 2013-10-01 DIAGNOSIS — H04129 Dry eye syndrome of unspecified lacrimal gland: Secondary | ICD-10-CM | POA: Diagnosis not present

## 2013-10-01 DIAGNOSIS — M545 Low back pain, unspecified: Secondary | ICD-10-CM | POA: Diagnosis not present

## 2013-10-01 DIAGNOSIS — M6281 Muscle weakness (generalized): Secondary | ICD-10-CM | POA: Diagnosis not present

## 2013-10-01 DIAGNOSIS — H0230 Blepharochalasis unspecified eye, unspecified eyelid: Secondary | ICD-10-CM | POA: Diagnosis not present

## 2013-10-01 DIAGNOSIS — IMO0001 Reserved for inherently not codable concepts without codable children: Secondary | ICD-10-CM | POA: Diagnosis not present

## 2013-10-01 DIAGNOSIS — Z9181 History of falling: Secondary | ICD-10-CM | POA: Diagnosis not present

## 2013-10-01 DIAGNOSIS — R262 Difficulty in walking, not elsewhere classified: Secondary | ICD-10-CM | POA: Diagnosis not present

## 2013-10-02 DIAGNOSIS — G936 Cerebral edema: Secondary | ICD-10-CM | POA: Diagnosis not present

## 2013-10-02 DIAGNOSIS — G40909 Epilepsy, unspecified, not intractable, without status epilepticus: Secondary | ICD-10-CM | POA: Diagnosis not present

## 2013-10-04 DIAGNOSIS — R262 Difficulty in walking, not elsewhere classified: Secondary | ICD-10-CM | POA: Diagnosis not present

## 2013-10-04 DIAGNOSIS — M545 Low back pain, unspecified: Secondary | ICD-10-CM | POA: Diagnosis not present

## 2013-10-04 DIAGNOSIS — Z9181 History of falling: Secondary | ICD-10-CM | POA: Diagnosis not present

## 2013-10-04 DIAGNOSIS — IMO0001 Reserved for inherently not codable concepts without codable children: Secondary | ICD-10-CM | POA: Diagnosis not present

## 2013-10-04 DIAGNOSIS — M6281 Muscle weakness (generalized): Secondary | ICD-10-CM | POA: Diagnosis not present

## 2013-10-08 DIAGNOSIS — R262 Difficulty in walking, not elsewhere classified: Secondary | ICD-10-CM | POA: Diagnosis not present

## 2013-10-08 DIAGNOSIS — Z9181 History of falling: Secondary | ICD-10-CM | POA: Diagnosis not present

## 2013-10-08 DIAGNOSIS — M545 Low back pain, unspecified: Secondary | ICD-10-CM | POA: Diagnosis not present

## 2013-10-08 DIAGNOSIS — M6281 Muscle weakness (generalized): Secondary | ICD-10-CM | POA: Diagnosis not present

## 2013-10-08 DIAGNOSIS — IMO0001 Reserved for inherently not codable concepts without codable children: Secondary | ICD-10-CM | POA: Diagnosis not present

## 2013-10-09 DIAGNOSIS — G894 Chronic pain syndrome: Secondary | ICD-10-CM | POA: Diagnosis not present

## 2013-10-09 DIAGNOSIS — M961 Postlaminectomy syndrome, not elsewhere classified: Secondary | ICD-10-CM | POA: Diagnosis not present

## 2013-10-09 DIAGNOSIS — G039 Meningitis, unspecified: Secondary | ICD-10-CM | POA: Diagnosis not present

## 2013-10-09 DIAGNOSIS — M533 Sacrococcygeal disorders, not elsewhere classified: Secondary | ICD-10-CM | POA: Diagnosis not present

## 2013-10-12 DIAGNOSIS — M545 Low back pain, unspecified: Secondary | ICD-10-CM | POA: Diagnosis not present

## 2013-10-12 DIAGNOSIS — Z9181 History of falling: Secondary | ICD-10-CM | POA: Diagnosis not present

## 2013-10-12 DIAGNOSIS — R262 Difficulty in walking, not elsewhere classified: Secondary | ICD-10-CM | POA: Diagnosis not present

## 2013-10-12 DIAGNOSIS — IMO0001 Reserved for inherently not codable concepts without codable children: Secondary | ICD-10-CM | POA: Diagnosis not present

## 2013-10-12 DIAGNOSIS — M6281 Muscle weakness (generalized): Secondary | ICD-10-CM | POA: Diagnosis not present

## 2013-10-17 DIAGNOSIS — R930 Abnormal findings on diagnostic imaging of skull and head, not elsewhere classified: Secondary | ICD-10-CM | POA: Diagnosis not present

## 2013-10-17 DIAGNOSIS — R6889 Other general symptoms and signs: Secondary | ICD-10-CM | POA: Diagnosis not present

## 2013-10-17 DIAGNOSIS — I6789 Other cerebrovascular disease: Secondary | ICD-10-CM | POA: Diagnosis not present

## 2013-10-17 DIAGNOSIS — I6350 Cerebral infarction due to unspecified occlusion or stenosis of unspecified cerebral artery: Secondary | ICD-10-CM | POA: Diagnosis not present

## 2013-10-24 DIAGNOSIS — Z7901 Long term (current) use of anticoagulants: Secondary | ICD-10-CM | POA: Diagnosis not present

## 2013-11-06 DIAGNOSIS — G4730 Sleep apnea, unspecified: Secondary | ICD-10-CM | POA: Diagnosis not present

## 2013-11-08 DIAGNOSIS — I6529 Occlusion and stenosis of unspecified carotid artery: Secondary | ICD-10-CM | POA: Diagnosis not present

## 2013-11-08 DIAGNOSIS — I2510 Atherosclerotic heart disease of native coronary artery without angina pectoris: Secondary | ICD-10-CM | POA: Diagnosis not present

## 2013-11-08 DIAGNOSIS — E785 Hyperlipidemia, unspecified: Secondary | ICD-10-CM | POA: Diagnosis not present

## 2013-11-08 DIAGNOSIS — I119 Hypertensive heart disease without heart failure: Secondary | ICD-10-CM | POA: Diagnosis not present

## 2013-11-22 DIAGNOSIS — H02839 Dermatochalasis of unspecified eye, unspecified eyelid: Secondary | ICD-10-CM | POA: Diagnosis not present

## 2013-12-11 DIAGNOSIS — Z86718 Personal history of other venous thrombosis and embolism: Secondary | ICD-10-CM | POA: Diagnosis not present

## 2013-12-11 DIAGNOSIS — R0602 Shortness of breath: Secondary | ICD-10-CM | POA: Diagnosis not present

## 2013-12-11 DIAGNOSIS — J984 Other disorders of lung: Secondary | ICD-10-CM | POA: Diagnosis not present

## 2013-12-11 DIAGNOSIS — J438 Other emphysema: Secondary | ICD-10-CM | POA: Diagnosis not present

## 2013-12-11 DIAGNOSIS — I2510 Atherosclerotic heart disease of native coronary artery without angina pectoris: Secondary | ICD-10-CM | POA: Diagnosis not present

## 2013-12-11 DIAGNOSIS — G40909 Epilepsy, unspecified, not intractable, without status epilepticus: Secondary | ICD-10-CM | POA: Diagnosis not present

## 2013-12-11 DIAGNOSIS — M549 Dorsalgia, unspecified: Secondary | ICD-10-CM | POA: Diagnosis not present

## 2013-12-11 DIAGNOSIS — Q619 Cystic kidney disease, unspecified: Secondary | ICD-10-CM | POA: Diagnosis not present

## 2013-12-11 DIAGNOSIS — R0902 Hypoxemia: Secondary | ICD-10-CM | POA: Diagnosis not present

## 2013-12-11 DIAGNOSIS — I10 Essential (primary) hypertension: Secondary | ICD-10-CM | POA: Diagnosis not present

## 2013-12-11 DIAGNOSIS — R911 Solitary pulmonary nodule: Secondary | ICD-10-CM | POA: Diagnosis not present

## 2013-12-11 DIAGNOSIS — I951 Orthostatic hypotension: Secondary | ICD-10-CM | POA: Diagnosis not present

## 2013-12-12 DIAGNOSIS — G40909 Epilepsy, unspecified, not intractable, without status epilepticus: Secondary | ICD-10-CM | POA: Diagnosis not present

## 2013-12-12 DIAGNOSIS — I10 Essential (primary) hypertension: Secondary | ICD-10-CM | POA: Diagnosis not present

## 2013-12-12 DIAGNOSIS — R0902 Hypoxemia: Secondary | ICD-10-CM | POA: Diagnosis not present

## 2013-12-12 DIAGNOSIS — Z0000 Encounter for general adult medical examination without abnormal findings: Secondary | ICD-10-CM | POA: Diagnosis not present

## 2013-12-12 DIAGNOSIS — R911 Solitary pulmonary nodule: Secondary | ICD-10-CM | POA: Diagnosis not present

## 2013-12-12 DIAGNOSIS — I2510 Atherosclerotic heart disease of native coronary artery without angina pectoris: Secondary | ICD-10-CM | POA: Diagnosis not present

## 2013-12-15 DIAGNOSIS — I2510 Atherosclerotic heart disease of native coronary artery without angina pectoris: Secondary | ICD-10-CM | POA: Diagnosis not present

## 2013-12-15 DIAGNOSIS — Z79899 Other long term (current) drug therapy: Secondary | ICD-10-CM | POA: Diagnosis not present

## 2013-12-15 DIAGNOSIS — Z7982 Long term (current) use of aspirin: Secondary | ICD-10-CM | POA: Diagnosis not present

## 2013-12-15 DIAGNOSIS — Z8673 Personal history of transient ischemic attack (TIA), and cerebral infarction without residual deficits: Secondary | ICD-10-CM | POA: Diagnosis not present

## 2013-12-15 DIAGNOSIS — R079 Chest pain, unspecified: Secondary | ICD-10-CM | POA: Diagnosis not present

## 2013-12-15 DIAGNOSIS — R5381 Other malaise: Secondary | ICD-10-CM | POA: Diagnosis not present

## 2013-12-15 DIAGNOSIS — R4182 Altered mental status, unspecified: Secondary | ICD-10-CM | POA: Diagnosis not present

## 2013-12-15 DIAGNOSIS — K219 Gastro-esophageal reflux disease without esophagitis: Secondary | ICD-10-CM | POA: Diagnosis not present

## 2013-12-15 DIAGNOSIS — I10 Essential (primary) hypertension: Secondary | ICD-10-CM | POA: Diagnosis not present

## 2013-12-15 DIAGNOSIS — E7800 Pure hypercholesterolemia, unspecified: Secondary | ICD-10-CM | POA: Diagnosis not present

## 2013-12-15 DIAGNOSIS — Z96649 Presence of unspecified artificial hip joint: Secondary | ICD-10-CM | POA: Diagnosis not present

## 2013-12-15 DIAGNOSIS — G319 Degenerative disease of nervous system, unspecified: Secondary | ICD-10-CM | POA: Diagnosis not present

## 2013-12-15 DIAGNOSIS — R5383 Other fatigue: Secondary | ICD-10-CM | POA: Diagnosis not present

## 2013-12-15 DIAGNOSIS — M129 Arthropathy, unspecified: Secondary | ICD-10-CM | POA: Diagnosis not present

## 2013-12-19 DIAGNOSIS — I10 Essential (primary) hypertension: Secondary | ICD-10-CM | POA: Diagnosis not present

## 2013-12-19 DIAGNOSIS — E039 Hypothyroidism, unspecified: Secondary | ICD-10-CM | POA: Diagnosis not present

## 2013-12-19 DIAGNOSIS — R569 Unspecified convulsions: Secondary | ICD-10-CM | POA: Diagnosis not present

## 2013-12-19 DIAGNOSIS — E785 Hyperlipidemia, unspecified: Secondary | ICD-10-CM | POA: Diagnosis not present

## 2013-12-19 DIAGNOSIS — G8929 Other chronic pain: Secondary | ICD-10-CM | POA: Diagnosis not present

## 2013-12-21 DIAGNOSIS — R569 Unspecified convulsions: Secondary | ICD-10-CM | POA: Diagnosis not present

## 2013-12-21 DIAGNOSIS — E039 Hypothyroidism, unspecified: Secondary | ICD-10-CM | POA: Diagnosis not present

## 2013-12-21 DIAGNOSIS — I10 Essential (primary) hypertension: Secondary | ICD-10-CM | POA: Diagnosis not present

## 2013-12-21 DIAGNOSIS — E785 Hyperlipidemia, unspecified: Secondary | ICD-10-CM | POA: Diagnosis not present

## 2013-12-21 DIAGNOSIS — G8929 Other chronic pain: Secondary | ICD-10-CM | POA: Diagnosis not present

## 2013-12-24 DIAGNOSIS — G8929 Other chronic pain: Secondary | ICD-10-CM | POA: Diagnosis not present

## 2013-12-24 DIAGNOSIS — E039 Hypothyroidism, unspecified: Secondary | ICD-10-CM | POA: Diagnosis not present

## 2013-12-24 DIAGNOSIS — E785 Hyperlipidemia, unspecified: Secondary | ICD-10-CM | POA: Diagnosis not present

## 2013-12-24 DIAGNOSIS — I10 Essential (primary) hypertension: Secondary | ICD-10-CM | POA: Diagnosis not present

## 2013-12-24 DIAGNOSIS — R569 Unspecified convulsions: Secondary | ICD-10-CM | POA: Diagnosis not present

## 2013-12-25 ENCOUNTER — Institutional Professional Consult (permissible substitution): Payer: Self-pay | Admitting: Critical Care Medicine

## 2013-12-25 DIAGNOSIS — I10 Essential (primary) hypertension: Secondary | ICD-10-CM | POA: Diagnosis not present

## 2013-12-25 DIAGNOSIS — E785 Hyperlipidemia, unspecified: Secondary | ICD-10-CM | POA: Diagnosis not present

## 2013-12-25 DIAGNOSIS — G8929 Other chronic pain: Secondary | ICD-10-CM | POA: Diagnosis not present

## 2013-12-25 DIAGNOSIS — E039 Hypothyroidism, unspecified: Secondary | ICD-10-CM | POA: Diagnosis not present

## 2013-12-25 DIAGNOSIS — R569 Unspecified convulsions: Secondary | ICD-10-CM | POA: Diagnosis not present

## 2013-12-26 DIAGNOSIS — G8929 Other chronic pain: Secondary | ICD-10-CM | POA: Diagnosis not present

## 2013-12-26 DIAGNOSIS — K219 Gastro-esophageal reflux disease without esophagitis: Secondary | ICD-10-CM | POA: Diagnosis not present

## 2013-12-26 DIAGNOSIS — M549 Dorsalgia, unspecified: Secondary | ICD-10-CM | POA: Diagnosis not present

## 2013-12-26 DIAGNOSIS — R0902 Hypoxemia: Secondary | ICD-10-CM | POA: Diagnosis not present

## 2013-12-27 DIAGNOSIS — R569 Unspecified convulsions: Secondary | ICD-10-CM | POA: Diagnosis not present

## 2013-12-27 DIAGNOSIS — I10 Essential (primary) hypertension: Secondary | ICD-10-CM | POA: Diagnosis not present

## 2013-12-27 DIAGNOSIS — E039 Hypothyroidism, unspecified: Secondary | ICD-10-CM | POA: Diagnosis not present

## 2013-12-27 DIAGNOSIS — E785 Hyperlipidemia, unspecified: Secondary | ICD-10-CM | POA: Diagnosis not present

## 2013-12-27 DIAGNOSIS — G8929 Other chronic pain: Secondary | ICD-10-CM | POA: Diagnosis not present

## 2014-01-01 DIAGNOSIS — E039 Hypothyroidism, unspecified: Secondary | ICD-10-CM | POA: Diagnosis not present

## 2014-01-01 DIAGNOSIS — E785 Hyperlipidemia, unspecified: Secondary | ICD-10-CM | POA: Diagnosis not present

## 2014-01-01 DIAGNOSIS — G8929 Other chronic pain: Secondary | ICD-10-CM | POA: Diagnosis not present

## 2014-01-01 DIAGNOSIS — I10 Essential (primary) hypertension: Secondary | ICD-10-CM | POA: Diagnosis not present

## 2014-01-01 DIAGNOSIS — R569 Unspecified convulsions: Secondary | ICD-10-CM | POA: Diagnosis not present

## 2014-01-03 DIAGNOSIS — E785 Hyperlipidemia, unspecified: Secondary | ICD-10-CM | POA: Diagnosis not present

## 2014-01-03 DIAGNOSIS — E039 Hypothyroidism, unspecified: Secondary | ICD-10-CM | POA: Diagnosis not present

## 2014-01-03 DIAGNOSIS — G8929 Other chronic pain: Secondary | ICD-10-CM | POA: Diagnosis not present

## 2014-01-03 DIAGNOSIS — R569 Unspecified convulsions: Secondary | ICD-10-CM | POA: Diagnosis not present

## 2014-01-03 DIAGNOSIS — I10 Essential (primary) hypertension: Secondary | ICD-10-CM | POA: Diagnosis not present

## 2014-01-07 ENCOUNTER — Encounter: Payer: Self-pay | Admitting: Critical Care Medicine

## 2014-01-08 ENCOUNTER — Ambulatory Visit (INDEPENDENT_AMBULATORY_CARE_PROVIDER_SITE_OTHER): Payer: Self-pay | Admitting: Critical Care Medicine

## 2014-01-08 ENCOUNTER — Encounter: Payer: Self-pay | Admitting: Critical Care Medicine

## 2014-01-08 VITALS — BP 98/60 | HR 92 | Temp 97.5°F | Ht 65.0 in | Wt 153.4 lb

## 2014-01-08 DIAGNOSIS — E039 Hypothyroidism, unspecified: Secondary | ICD-10-CM | POA: Diagnosis not present

## 2014-01-08 DIAGNOSIS — J9610 Chronic respiratory failure, unspecified whether with hypoxia or hypercapnia: Secondary | ICD-10-CM

## 2014-01-08 DIAGNOSIS — I739 Peripheral vascular disease, unspecified: Secondary | ICD-10-CM | POA: Insufficient documentation

## 2014-01-08 DIAGNOSIS — J439 Emphysema, unspecified: Secondary | ICD-10-CM

## 2014-01-08 DIAGNOSIS — E785 Hyperlipidemia, unspecified: Secondary | ICD-10-CM | POA: Insufficient documentation

## 2014-01-08 DIAGNOSIS — I10 Essential (primary) hypertension: Secondary | ICD-10-CM | POA: Diagnosis not present

## 2014-01-08 DIAGNOSIS — R0902 Hypoxemia: Secondary | ICD-10-CM

## 2014-01-08 DIAGNOSIS — R911 Solitary pulmonary nodule: Secondary | ICD-10-CM

## 2014-01-08 DIAGNOSIS — J438 Other emphysema: Secondary | ICD-10-CM

## 2014-01-08 DIAGNOSIS — G8929 Other chronic pain: Secondary | ICD-10-CM | POA: Diagnosis not present

## 2014-01-08 DIAGNOSIS — I119 Hypertensive heart disease without heart failure: Secondary | ICD-10-CM | POA: Insufficient documentation

## 2014-01-08 DIAGNOSIS — R569 Unspecified convulsions: Secondary | ICD-10-CM | POA: Diagnosis not present

## 2014-01-08 DIAGNOSIS — J449 Chronic obstructive pulmonary disease, unspecified: Secondary | ICD-10-CM | POA: Diagnosis not present

## 2014-01-08 HISTORY — DX: Emphysema, unspecified: J43.9

## 2014-01-08 HISTORY — DX: Chronic respiratory failure, unspecified whether with hypoxia or hypercapnia: J96.10

## 2014-01-08 MED ORDER — TIOTROPIUM BROMIDE MONOHYDRATE 2.5 MCG/ACT IN AERS: INHALATION_SPRAY | RESPIRATORY_TRACT | Status: DC

## 2014-01-08 MED ORDER — TIOTROPIUM BROMIDE MONOHYDRATE 2.5 MCG/ACT IN AERS: 2.0000 | INHALATION_SPRAY | Freq: Every day | RESPIRATORY_TRACT | Status: DC

## 2014-01-08 NOTE — Progress Notes (Signed)
Subjective:    Patient ID: Cheyenne Cheyenne [name], female    DOB: 1941-01-17, 73 y.o.   MRN: [id number]  HPI Comments: Referred for eval of nocturnal desaturation.    Pt not having any dyspnea.  Test done at [facility]. No dyspnea at all during the daytime  Mild sleep apnea on testing 10/2013.  Rx oxygen but not taking at all times Saturations 90-95%.    Shortness of Cheyenne [name] This is a new problem. The current episode started more than 1 month ago. Episode frequency: only issues is at night. Pertinent negatives include no abdominal pain, chest pain, claudication, hemoptysis, leg swelling, orthopnea, PND, rhinorrhea, sore throat, sputum production or wheezing. Risk factors include smoking. Her past medical history is significant for CAD. There is no history of asthma, chronic lung disease, COPD, DVT, a heart failure, PE or pneumonia.  Pt was in [facility] 11/[facility] for hypoxemia. Adm from nephrology clinic CT angio neg for PE, found emphysema and LUL nodule.  Rx home oxygen. No pfts done , felt was d/t copd. Meds were adjusted.  Past Medical History  Diagnosis Date  . Multi-vessel coronary artery stenosis   . Hyperlipidemia   . Hypertensive heart disease   . Spondylosis, lumbosacral   . PVD (peripheral vascular disease)   . Seizure disorder   . Stricture of artery      Family History  Problem Relation Age of Onset  . Heart disease Mother   . Heart disease Father   . Heart disease Brother      History   Social History  . Marital Status: Single    Spouse Name: N/A    Number of Children: N/A  . Years of Education: N/A   Occupational History  . Retired     Hydrographic surveyor   Social History Main Topics  . Smoking status: Former Smoker -- 1.50 packs/day for 12 years    Types: Cigarettes    Quit date: 07/27/1995  . Smokeless tobacco: Never Used  . Alcohol Use: Not on file  . Drug Use: Not on file  . Sexual Activity: Not on file   Other Topics Concern  . Not on file   Social History  Narrative  . No narrative on file     Allergies  Allergen Reactions  . Asa [Aspirin]     nausated  . Duloxetine Hcl Diarrhea  . Phenergan [Promethazine Hcl]     Hallucinations, "go crazy"  . Penicillins Rash    Rash, itching - unsure of exact reaction  . Promethazine Anxiety     Outpatient Prescriptions Prior to Visit  Medication Sig Dispense Refill  . aspirin 81 MG tablet Take 81 mg by mouth daily.      . Cholecalciferol 1000 UNITS capsule Take 1,000 Units by mouth daily.      . cloNIDine (CATAPRES) 0.1 MG tablet Take 0.1 mg by mouth daily.      . DULoxetine (CYMBALTA) 30 MG capsule Take 30 mg by mouth 2 (two) times daily.      . folic acid (FOLVITE) 1 MG tablet Take 1 mg by mouth daily.      Marland Kitchen levETIRAcetam (KEPPRA) 1000 MG tablet Take by mouth. 1000 mg in the morning and 2000mg  at night      . levothyroxine (SYNTHROID, LEVOTHROID) 50 MCG tablet Take 50 mcg by mouth daily before breakfast.      . nitroGLYCERIN (NITROSTAT) 0.4 MG SL tablet Place 0.4 mg under the tongue every 5 (five) minutes as  needed for chest pain.      Marland Kitchen oxyCODONE-acetaminophen (PERCOCET) 10-325 MG per tablet Take by mouth. 1 tablet in the morning, 1/2 tablet at noon and in the evening, and 1 tablet at bedtime.      . pantoprazole (PROTONIX) 40 MG tablet Take 40 mg by mouth daily.      . pregabalin (LYRICA) 50 MG capsule Take 50 mg by mouth 3 (three) times daily.      . simvastatin (ZOCOR) 20 MG tablet Take 20 mg by mouth daily.      . vitamin B-12 (CYANOCOBALAMIN) 100 MCG tablet Take 500 mcg by mouth daily.       . vitamin C (ASCORBIC ACID) 500 MG tablet Take 500 mg by mouth 2 (two) times daily.       . chlorzoxazone (PARAFON) 500 MG tablet Take 500 mg by mouth 3 (three) times daily as needed for muscle spasms.      . fentaNYL (DURAGESIC - DOSED MCG/HR) 50 MCG/HR Place 50 mcg onto the skin every 3 (three) days.      Marland Kitchen thiamine 50 MG tablet Take 50 mg by mouth daily.       No facility-administered medications  prior to visit.     Review of Systems  HENT: Negative for rhinorrhea, sore throat, trouble swallowing and voice change.   Respiratory: Positive for shortness of Cheyenne [name]. Negative for hemoptysis, sputum production and wheezing.   Cardiovascular: Negative for chest pain, orthopnea, claudication, leg swelling and PND.  Gastrointestinal: Negative.  Negative for abdominal pain.       Objective:   Physical Exam Filed Vitals:   01/08/14 1437 01/08/14 1449  BP:  98/60  Pulse:  92  Temp:  97.5 F (36.4 C)  TempSrc:  Oral  Height:  5\' 5"  (1.651 m)  Weight:  153 lb 6.4 oz (69.582 kg)  SpO2: 87% 93%    Gen: Pleasant, well-nourished, in no distress,  normal affect, sitting in wheelchair with husband in attendance  ENT: No lesions,  mouth clear,  oropharynx clear, no postnasal drip  Neck: No JVD, no TMG, no carotid bruits  Lungs: No use of accessory muscles, no dullness to percussion, distant BS  Cardiovascular: RRR, heart sounds normal, no murmur or gallops, no peripheral edema  Abdomen: soft and NT, no HSM,  BS normal  Musculoskeletal: No deformities, no cyanosis or clubbing  Neuro: alert, non focal  Skin: Warm, no lesions or rashes  No results found. CXR and CT Chest reviewed.  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 I suspect hypoxemia seen at [facility] 11/2013 admission was due to medication induced decreased drive to Cheyenne [name]. Note sleep study showed only MILD sleep apnea Plan Start Spiriva Respimat two puff daily Stay on oxygen 2Liters QHS only No other medication changes Watch for ongoing high risk medications and CNS side effects   Chronic respiratory failure Hypoxemia redemonstrated Significant copd on pfts Plan  Cont oxygen 2L qhs Start spiriva  daily   Lung nodule 8 x 4 mm LUL :  Needs f/u CT Chest in 05/2014   Updated Medication List Outpatient Encounter Prescriptions as of 01/08/2014  Medication Sig  . aspirin 81 MG tablet Take 81 mg by mouth daily.  . Calcium Carbonate-Vitamin D (CALCIUM 600+D) 600-400 MG-UNIT per tablet  Take 1 tablet by mouth daily.  . Cholecalciferol 1000 UNITS capsule Take 1,000 Units by mouth daily.  . cloNIDine (CATAPRES) 0.1 MG tablet Take 0.1 mg by mouth daily.  . DULoxetine (CYMBALTA) 30 MG capsule Take 30 mg by mouth 2 (two) times daily.  . folic acid (FOLVITE) 1 MG tablet Take 1 mg by mouth daily.  Marland Kitchen levETIRAcetam (KEPPRA) 1000 MG tablet Take by mouth. 1000 mg in the morning and 2000mg  at night  . levothyroxine (SYNTHROID, LEVOTHROID) 50 MCG tablet Take 50 mcg by mouth daily before breakfast.  . Multiple Vitamin (MULTIVITAMIN) tablet Take 1 tablet by mouth daily.  . naproxen sodium (ALEVE) 220 MG tablet Take by mouth. Takes 1 in the morning, 2 at noon, 2 in the evening, and 2 at bedtime  . nitroGLYCERIN (NITROSTAT) 0.4 MG SL tablet Place 0.4 mg under the tongue every 5 (five) minutes as needed for chest pain.  Marland Kitchen oxyCODONE-acetaminophen (PERCOCET) 10-325 MG per tablet Take by mouth. 1 tablet in the morning, 1/2 tablet at noon and in the evening, and 1 tablet at bedtime.  . pantoprazole (PROTONIX) 40 MG tablet Take 40 mg by mouth daily.  . pregabalin (LYRICA) 50 MG capsule Take 50 mg by mouth 3 (three) times daily.  . simvastatin (ZOCOR) 20 MG tablet Take 20 mg by mouth daily.  . Thiamine HCl (VITAMIN B-1 PO) Take 0.5 tablets by mouth 2 (two) times daily.  . vitamin B-12 (CYANOCOBALAMIN) 100 MCG tablet Take 500 mcg by mouth daily.   . vitamin C (ASCORBIC ACID) 500 MG tablet Take 500 mg by mouth 2 (two) times daily.   . Tiotropium Bromide Monohydrate (SPIRIVA RESPIMAT) 2.5 MCG/ACT AERS Two puff daily  . Tiotropium Bromide Monohydrate (SPIRIVA RESPIMAT) 2.5 MCG/ACT AERS Inhale 2 puffs into the lungs  daily.  . [DISCONTINUED] chlorzoxazone (PARAFON) 500 MG tablet Take 500 mg by mouth 3 (three) times daily as needed for muscle spasms.  . [DISCONTINUED] fentaNYL (DURAGESIC - DOSED MCG/HR) 50 MCG/HR Place 50 mcg onto the skin every 3 (three) days.  . [DISCONTINUED] thiamine 50 MG tablet Take 50 mg by mouth daily.

## 2014-01-08 NOTE — Assessment & Plan Note (Signed)
Hypoxemia redemonstrated Significant copd on pfts Plan  Cont oxygen 2L qhs Start spiriva daily

## 2014-01-08 NOTE — Patient Instructions (Signed)
Wear your oxygen every night 2Liters Start Spiriva respimat two puff daily Return 2 months

## 2014-01-08 NOTE — Assessment & Plan Note (Signed)
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 I suspect hypoxemia seen at [facility] 11/2013 admission was due to medication induced decreased drive to breath. Note sleep study showed only MILD sleep apnea Plan Start Spiriva Respimat two puff daily Stay on oxygen 2Liters QHS only No other medication changes Watch for ongoing high risk medications and CNS side effects

## 2014-01-10 DIAGNOSIS — E039 Hypothyroidism, unspecified: Secondary | ICD-10-CM | POA: Diagnosis not present

## 2014-01-10 DIAGNOSIS — G8929 Other chronic pain: Secondary | ICD-10-CM | POA: Diagnosis not present

## 2014-01-10 DIAGNOSIS — I10 Essential (primary) hypertension: Secondary | ICD-10-CM | POA: Diagnosis not present

## 2014-01-10 DIAGNOSIS — E785 Hyperlipidemia, unspecified: Secondary | ICD-10-CM | POA: Diagnosis not present

## 2014-01-10 DIAGNOSIS — R569 Unspecified convulsions: Secondary | ICD-10-CM | POA: Diagnosis not present

## 2014-01-14 ENCOUNTER — Telehealth: Payer: Self-pay | Admitting: Critical Care Medicine

## 2014-01-14 DIAGNOSIS — E039 Hypothyroidism, unspecified: Secondary | ICD-10-CM | POA: Diagnosis not present

## 2014-01-14 DIAGNOSIS — G8929 Other chronic pain: Secondary | ICD-10-CM | POA: Diagnosis not present

## 2014-01-14 DIAGNOSIS — R569 Unspecified convulsions: Secondary | ICD-10-CM | POA: Diagnosis not present

## 2014-01-14 DIAGNOSIS — E785 Hyperlipidemia, unspecified: Secondary | ICD-10-CM | POA: Diagnosis not present

## 2014-01-14 DIAGNOSIS — I10 Essential (primary) hypertension: Secondary | ICD-10-CM | POA: Diagnosis not present

## 2014-01-14 MED ORDER — TIOTROPIUM BROMIDE MONOHYDRATE 2.5 MCG/ACT IN AERS: 2.0000 | INHALATION_SPRAY | Freq: Every day | RESPIRATORY_TRACT | Status: DC

## 2014-01-14 NOTE — Telephone Encounter (Signed)
I spoke withs pouse. Aware will send PA forms and sample to [place] for pick up tomorrow. He will return these forms once everything is done. Nothing further needed

## 2014-01-16 DIAGNOSIS — G8929 Other chronic pain: Secondary | ICD-10-CM | POA: Diagnosis not present

## 2014-01-16 DIAGNOSIS — E039 Hypothyroidism, unspecified: Secondary | ICD-10-CM | POA: Diagnosis not present

## 2014-01-16 DIAGNOSIS — I10 Essential (primary) hypertension: Secondary | ICD-10-CM | POA: Diagnosis not present

## 2014-01-16 DIAGNOSIS — E785 Hyperlipidemia, unspecified: Secondary | ICD-10-CM | POA: Diagnosis not present

## 2014-01-16 DIAGNOSIS — R569 Unspecified convulsions: Secondary | ICD-10-CM | POA: Diagnosis not present

## 2014-01-21 DIAGNOSIS — I10 Essential (primary) hypertension: Secondary | ICD-10-CM | POA: Diagnosis not present

## 2014-01-21 DIAGNOSIS — G8929 Other chronic pain: Secondary | ICD-10-CM | POA: Diagnosis not present

## 2014-01-21 DIAGNOSIS — E785 Hyperlipidemia, unspecified: Secondary | ICD-10-CM | POA: Diagnosis not present

## 2014-01-21 DIAGNOSIS — E039 Hypothyroidism, unspecified: Secondary | ICD-10-CM | POA: Diagnosis not present

## 2014-01-21 DIAGNOSIS — R569 Unspecified convulsions: Secondary | ICD-10-CM | POA: Diagnosis not present

## 2014-01-30 DIAGNOSIS — E782 Mixed hyperlipidemia: Secondary | ICD-10-CM | POA: Diagnosis not present

## 2014-01-30 DIAGNOSIS — M549 Dorsalgia, unspecified: Secondary | ICD-10-CM | POA: Diagnosis not present

## 2014-01-30 DIAGNOSIS — G8929 Other chronic pain: Secondary | ICD-10-CM | POA: Diagnosis not present

## 2014-02-05 ENCOUNTER — Telehealth: Payer: Self-pay | Admitting: Critical Care Medicine

## 2014-02-05 MED ORDER — TIOTROPIUM BROMIDE MONOHYDRATE 2.5 MCG/ACT IN AERS: INHALATION_SPRAY | RESPIRATORY_TRACT | Status: DC

## 2014-02-05 NOTE — Telephone Encounter (Signed)
Pt brought a spiriva respimat inhaler and left at [facility] office stating it was defective and requesting a sample. I have placed sample at the front at [facility] office for pick up. Pt aware.

## 2014-02-15 ENCOUNTER — Telehealth: Payer: Self-pay | Admitting: Critical Care Medicine

## 2014-02-15 MED ORDER — TIOTROPIUM BROMIDE MONOHYDRATE 2.5 MCG/ACT IN AERS: 2.0000 | INHALATION_SPRAY | Freq: Every day | RESPIRATORY_TRACT | Status: DC

## 2014-02-15 NOTE — Telephone Encounter (Signed)
Pt brought patient assistance forms to the [facility] office Pt's portion is filled out w/ financial information included.  All that is needed is PW's signature, date and prescription (done and attached to forms)  Forms given back to Crystal with printed rx to ensure follow up

## 2014-02-18 NOTE — Telephone Encounter (Signed)
PW signed application and spiriva respimat rx. I have faxed this to [employer] Pt assistance Program at [phone] alont with the financial information provided by pt. Pt aware and is aware to call office back if she doesn't hear a response within the next 1-2 wks. She verbalized understanding and voiced no further questions or concerns at this time.

## 2014-03-04 DIAGNOSIS — G8929 Other chronic pain: Secondary | ICD-10-CM | POA: Diagnosis not present

## 2014-03-04 DIAGNOSIS — M549 Dorsalgia, unspecified: Secondary | ICD-10-CM | POA: Diagnosis not present

## 2014-03-04 DIAGNOSIS — L659 Nonscarring hair loss, unspecified: Secondary | ICD-10-CM | POA: Diagnosis not present

## 2014-03-05 ENCOUNTER — Telehealth: Payer: Self-pay | Admitting: Critical Care Medicine

## 2014-03-05 ENCOUNTER — Ambulatory Visit (INDEPENDENT_AMBULATORY_CARE_PROVIDER_SITE_OTHER): Payer: Self-pay | Admitting: Critical Care Medicine

## 2014-03-05 ENCOUNTER — Encounter: Payer: Self-pay | Admitting: Critical Care Medicine

## 2014-03-05 VITALS — BP 108/60 | HR 69 | Temp 98.2°F | Ht 65.0 in | Wt 154.4 lb

## 2014-03-05 DIAGNOSIS — J449 Chronic obstructive pulmonary disease, unspecified: Secondary | ICD-10-CM | POA: Diagnosis not present

## 2014-03-05 DIAGNOSIS — R911 Solitary pulmonary nodule: Secondary | ICD-10-CM

## 2014-03-05 DIAGNOSIS — J438 Other emphysema: Secondary | ICD-10-CM

## 2014-03-05 DIAGNOSIS — Z9181 History of falling: Secondary | ICD-10-CM | POA: Insufficient documentation

## 2014-03-05 DIAGNOSIS — J432 Centrilobular emphysema: Secondary | ICD-10-CM

## 2014-03-05 DIAGNOSIS — Z23 Encounter for immunization: Secondary | ICD-10-CM

## 2014-03-05 DIAGNOSIS — Z79899 Other long term (current) drug therapy: Secondary | ICD-10-CM | POA: Insufficient documentation

## 2014-03-05 DIAGNOSIS — J984 Other disorders of lung: Secondary | ICD-10-CM | POA: Diagnosis not present

## 2014-03-05 HISTORY — DX: Other long term (current) drug therapy: Z79.899

## 2014-03-05 HISTORY — DX: History of falling: Z91.81

## 2014-03-05 NOTE — Assessment & Plan Note (Signed)
Solitary lung nodule Needs f/u 05/2014

## 2014-03-05 NOTE — Patient Instructions (Addendum)
Stay on oxygen 2Liters at night Stay on spiriva Prevnar 13 vaccine was given Return 4 months Obtain CT Chest November 2015 for lung nodule follow up

## 2014-03-05 NOTE — Telephone Encounter (Signed)
During Visit today, pt states she has not heard from [place] for [employer].   I called program at [phone], spoke with Diori.  Was advised pt is temporarily enrolled in program through Sept 28, 2015.  Spiriva respimat was shipped on Feb 25, 2014 through [place].  Diori tracked the package - states it is still showing in transit; pt should receive the end of this week to the beginning of next wk.  If she does not, she will need to contact the program.  Also, Diori states pt will receive letter explaining the pt assistance process and any further steps that need to be taken if applicable.  If pt has questions about this, per Diori, she can call them.  Called, spoke with pt.  Explained above to her.  Pt reports she did receive the Spiriva Respimat today along with a letter.  She will call office back or the [employer] if she has any further questions or concerns.

## 2014-03-05 NOTE — Assessment & Plan Note (Signed)
Need for chronic pain meds for low back pain and cross reactivity Pt aware of situation

## 2014-03-05 NOTE — Assessment & Plan Note (Signed)
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

## 2014-03-05 NOTE — Progress Notes (Signed)
Subjective:    Patient ID: Cheyenne [name], female    DOB: 03-Dec-1940, 73 y.o.   MRN: [id number]  HPI 03/05/2014 Chief Complaint  Patient presents with  . Follow-up    Feeling good-no sob,no cough,c/o gets tangled in cord with oxygen at home-2L  F/u Copd. On nocturnal oxygen therapy. Continues to be ok on dyspnea.  Gets tangled in oxygen cord.   No real chest pain.  No mucus or cough.  Notes some edema in the feet.  Still with chronic pain in back area.  Spinal surgeries failed.  No recent falls.   Review of Systems Constitutional:   No  weight loss, night sweats,  Fevers, chills, fatigue, lassitude. HEENT:   No headaches,  Difficulty swallowing,  Tooth/dental problems,  Sore throat,                No sneezing, itching, ear ache, nasal congestion, post nasal drip,   CV:  No chest pain,  Orthopnea, PND, swelling in lower extremities, anasarca, dizziness, palpitations  GI  No heartburn, indigestion, abdominal pain, nausea, vomiting, diarrhea, change in bowel habits, loss of appetite  Resp: Notes  [name] with exertion not at rest.  No excess mucus, no productive cough,  No non-productive cough,  No coughing up of blood.  No change in color of mucus.  No wheezing.  No chest wall deformity  Skin: no rash or lesions.  GU: no dysuria, change in color of urine, no urgency or frequency.  No flank pain.  MS:  Severe chronic back pain   Psych:  No change in mood or affect. No depression or anxiety.  No memory loss.     Objective:   Physical Exam Filed Vitals:   03/05/14 1222  BP: 108/60  Pulse: 69  Temp: 98.2 F (36.8 C)  TempSrc: Oral  Height: 5\' 5"  (1.651 m)  Weight: 154 lb 6.4 oz (70.035 kg)  SpO2: 93%    Gen: Pleasant, well-nourished, in no distress,  normal affect  ENT: No lesions,  mouth clear,  oropharynx clear, no postnasal drip  Neck: No JVD, no TMG, no carotid bruits  Lungs: No use of accessory muscles, no dullness to percussion, distant  BS  Cardiovascular: RRR, heart sounds normal, no murmur or gallops, no peripheral edema  Abdomen: soft and NT, no HSM,  BS normal  Musculoskeletal: No deformities, no cyanosis or clubbing  Neuro: alert, non focal  Skin: Warm, no lesions or rashes  No results found.  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 Calcium Carbonate-Vitamin D (CALCIUM 600+D) 600-400 MG-UNIT per tablet Take 1 tablet by mouth daily.  . Cholecalciferol 1000 UNITS capsule Take 1,000 Units by mouth daily.  . cloNIDine (CATAPRES) 0.1 MG tablet Take 0.1 mg by mouth  daily.  . DULoxetine (CYMBALTA) 30 MG capsule Take 30 mg by mouth 2 (two) times daily.  . folic acid (FOLVITE) 1 MG tablet Take 1 mg by mouth daily.  Marland Kitchen levETIRAcetam (KEPPRA) 1000 MG tablet Take by mouth. 1000 mg in the morning and 2000mg  at night  . levothyroxine (SYNTHROID, LEVOTHROID) 50 MCG tablet Take 50 mcg by mouth daily before breakfast.  . Multiple Vitamin (MULTIVITAMIN) tablet Take 1 tablet by mouth daily.  . naproxen sodium (ALEVE) 220 MG tablet Take by mouth. Takes 2  in the morning, 2 at noon, 2 in the evening,  . nitroGLYCERIN (NITROSTAT) 0.4 MG SL tablet Place 0.4 mg under the tongue every 5 (five) minutes as needed for chest pain.  Marland Kitchen oxyCODONE-acetaminophen  (PERCOCET) 10-325 MG per tablet Take by mouth. 1 tablet in the morning, 1/2 tablet at noon and in the evening, and 1 tablet at bedtime.  . pantoprazole (PROTONIX) 40 MG tablet Take 40 mg by mouth daily.  . pregabalin (LYRICA) 50 MG capsule Take 50 mg by mouth 3 (three) times daily.  . Thiamine HCl (VITAMIN B-1 PO) Take 0.5 tablets by mouth 2 (two) times daily.  . Tiotropium Bromide Monohydrate (SPIRIVA RESPIMAT) 2.5 MCG/ACT AERS Inhale 2 puffs into the lungs daily.  . vitamin B-12 (CYANOCOBALAMIN) 100 MCG tablet Take 500 mcg by mouth daily.   . vitamin C (ASCORBIC ACID) 500 MG tablet Take 500 mg by mouth 2 (two) times daily.   . [DISCONTINUED] aspirin 81 MG tablet Take 81 mg by mouth daily.  . [DISCONTINUED] fentaNYL (DURAGESIC) 50 MCG/HR Place 1 patch onto the skin. As needed  . [DISCONTINUED] simvastatin (ZOCOR) 20 MG tablet Take 20 mg by mouth daily.

## 2014-04-04 DIAGNOSIS — N3000 Acute cystitis without hematuria: Secondary | ICD-10-CM | POA: Diagnosis not present

## 2014-04-04 DIAGNOSIS — Z23 Encounter for immunization: Secondary | ICD-10-CM | POA: Diagnosis not present

## 2014-04-19 DIAGNOSIS — H5340 Unspecified visual field defects: Secondary | ICD-10-CM | POA: Diagnosis not present

## 2014-04-19 DIAGNOSIS — H02839 Dermatochalasis of unspecified eye, unspecified eyelid: Secondary | ICD-10-CM | POA: Diagnosis not present

## 2014-05-06 DIAGNOSIS — G8929 Other chronic pain: Secondary | ICD-10-CM | POA: Diagnosis not present

## 2014-05-06 DIAGNOSIS — N3020 Other chronic cystitis without hematuria: Secondary | ICD-10-CM | POA: Diagnosis not present

## 2014-05-06 DIAGNOSIS — M549 Dorsalgia, unspecified: Secondary | ICD-10-CM | POA: Diagnosis not present

## 2014-05-06 DIAGNOSIS — Z23 Encounter for immunization: Secondary | ICD-10-CM | POA: Diagnosis not present

## 2014-05-07 DIAGNOSIS — M25551 Pain in right hip: Secondary | ICD-10-CM | POA: Diagnosis not present

## 2014-05-07 DIAGNOSIS — M5136 Other intervertebral disc degeneration, lumbar region: Secondary | ICD-10-CM

## 2014-05-07 DIAGNOSIS — M5416 Radiculopathy, lumbar region: Secondary | ICD-10-CM | POA: Diagnosis not present

## 2014-05-07 DIAGNOSIS — M51369 Other intervertebral disc degeneration, lumbar region without mention of lumbar back pain or lower extremity pain: Secondary | ICD-10-CM

## 2014-05-07 DIAGNOSIS — M4726 Other spondylosis with radiculopathy, lumbar region: Secondary | ICD-10-CM | POA: Insufficient documentation

## 2014-05-07 DIAGNOSIS — M5410 Radiculopathy, site unspecified: Secondary | ICD-10-CM

## 2014-05-07 DIAGNOSIS — K219 Gastro-esophageal reflux disease without esophagitis: Secondary | ICD-10-CM

## 2014-05-07 HISTORY — DX: Other intervertebral disc degeneration, lumbar region without mention of lumbar back pain or lower extremity pain: M51.369

## 2014-05-07 HISTORY — DX: Gastro-esophageal reflux disease without esophagitis: K21.9

## 2014-05-07 HISTORY — DX: Other spondylosis with radiculopathy, lumbar region: M47.26

## 2014-05-07 HISTORY — DX: Radiculopathy, site unspecified: M54.10

## 2014-05-07 HISTORY — DX: Other intervertebral disc degeneration, lumbar region: M51.36

## 2014-05-10 ENCOUNTER — Telehealth: Payer: Self-pay | Admitting: Critical Care Medicine

## 2014-05-10 NOTE — Telephone Encounter (Signed)
Called and spoke with pts husband and he is aware of ct scan date and time.  He is requesting that samples be brought to the office in [place] for spiriva on Tuesday and he will come by at that time and pick these up.  Will forward to crystal to make her aware.

## 2014-05-13 MED ORDER — TIOTROPIUM BROMIDE MONOHYDRATE 2.5 MCG/ACT IN AERS: 2.0000 | INHALATION_SPRAY | Freq: Every day | RESPIRATORY_TRACT | Status: DC

## 2014-05-13 NOTE — Telephone Encounter (Signed)
2 Spiriva Respimat samples will be available for pick up in the [place] office tomorrow morning after 9 am.  Pt aware and voiced no further questions or concerns at this time.

## 2014-05-23 DIAGNOSIS — Z9861 Coronary angioplasty status: Secondary | ICD-10-CM | POA: Diagnosis not present

## 2014-05-23 DIAGNOSIS — I82409 Acute embolism and thrombosis of unspecified deep veins of unspecified lower extremity: Secondary | ICD-10-CM | POA: Diagnosis not present

## 2014-05-23 DIAGNOSIS — M549 Dorsalgia, unspecified: Secondary | ICD-10-CM | POA: Diagnosis not present

## 2014-05-23 DIAGNOSIS — N390 Urinary tract infection, site not specified: Secondary | ICD-10-CM

## 2014-05-23 DIAGNOSIS — R0902 Hypoxemia: Secondary | ICD-10-CM | POA: Diagnosis not present

## 2014-05-23 DIAGNOSIS — G8929 Other chronic pain: Secondary | ICD-10-CM | POA: Diagnosis not present

## 2014-05-23 DIAGNOSIS — I10 Essential (primary) hypertension: Secondary | ICD-10-CM | POA: Diagnosis not present

## 2014-05-23 DIAGNOSIS — I2510 Atherosclerotic heart disease of native coronary artery without angina pectoris: Secondary | ICD-10-CM | POA: Diagnosis not present

## 2014-05-23 HISTORY — DX: Urinary tract infection, site not specified: N39.0

## 2014-05-29 DIAGNOSIS — N390 Urinary tract infection, site not specified: Secondary | ICD-10-CM | POA: Diagnosis not present

## 2014-05-29 DIAGNOSIS — N3020 Other chronic cystitis without hematuria: Secondary | ICD-10-CM | POA: Insufficient documentation

## 2014-05-29 DIAGNOSIS — R319 Hematuria, unspecified: Secondary | ICD-10-CM | POA: Diagnosis not present

## 2014-05-29 HISTORY — DX: Other chronic cystitis without hematuria: N30.20

## 2014-06-05 DIAGNOSIS — G8929 Other chronic pain: Secondary | ICD-10-CM | POA: Diagnosis not present

## 2014-06-05 DIAGNOSIS — N390 Urinary tract infection, site not specified: Secondary | ICD-10-CM | POA: Diagnosis not present

## 2014-06-05 DIAGNOSIS — M549 Dorsalgia, unspecified: Secondary | ICD-10-CM | POA: Diagnosis not present

## 2014-06-05 DIAGNOSIS — J449 Chronic obstructive pulmonary disease, unspecified: Secondary | ICD-10-CM | POA: Diagnosis not present

## 2014-06-07 ENCOUNTER — Telehealth: Payer: Self-pay

## 2014-06-07 DIAGNOSIS — R911 Solitary pulmonary nodule: Secondary | ICD-10-CM

## 2014-06-07 NOTE — Telephone Encounter (Signed)
Ct chest ordered, pt aware. Nothing further needed.

## 2014-06-07 NOTE — Telephone Encounter (Signed)
-----   Message from Elsie Stain, MD sent at 06/07/2014  2:30 PM EST ----- Needs ct chest non contrast f/u lung nodule ----- Message -----    From: Elsie Stain, MD    Sent: 03/05/2014  12:45 PM      To: Elsie Stain, MD  chk CT chest

## 2014-06-11 DIAGNOSIS — Z1231 Encounter for screening mammogram for malignant neoplasm of breast: Secondary | ICD-10-CM | POA: Diagnosis not present

## 2014-06-12 DIAGNOSIS — J449 Chronic obstructive pulmonary disease, unspecified: Secondary | ICD-10-CM | POA: Diagnosis not present

## 2014-06-12 DIAGNOSIS — J984 Other disorders of lung: Secondary | ICD-10-CM | POA: Diagnosis not present

## 2014-06-12 DIAGNOSIS — J432 Centrilobular emphysema: Secondary | ICD-10-CM | POA: Diagnosis not present

## 2014-06-12 DIAGNOSIS — J439 Emphysema, unspecified: Secondary | ICD-10-CM | POA: Diagnosis not present

## 2014-06-17 ENCOUNTER — Telehealth: Payer: Self-pay | Admitting: Critical Care Medicine

## 2014-06-17 DIAGNOSIS — J432 Centrilobular emphysema: Secondary | ICD-10-CM

## 2014-06-17 DIAGNOSIS — R911 Solitary pulmonary nodule: Secondary | ICD-10-CM

## 2014-06-17 NOTE — Telephone Encounter (Signed)
Call pt and tell her:  CT Chest showed NO nodule  Emphysema only

## 2014-06-17 NOTE — Telephone Encounter (Signed)
lmomtcb for pt on home #   ATC pt on cell # - spoke with husband who will ask pt to call office back.

## 2014-06-17 NOTE — Telephone Encounter (Signed)
I spoke with patient about results and she verbalized understanding and had no questions 

## 2014-06-17 NOTE — Telephone Encounter (Signed)
[phone] calling back

## 2014-07-01 DIAGNOSIS — J441 Chronic obstructive pulmonary disease with (acute) exacerbation: Secondary | ICD-10-CM | POA: Diagnosis not present

## 2014-07-10 ENCOUNTER — Encounter: Payer: Self-pay | Admitting: Critical Care Medicine

## 2014-08-21 DIAGNOSIS — M549 Dorsalgia, unspecified: Secondary | ICD-10-CM | POA: Diagnosis not present

## 2014-08-21 DIAGNOSIS — N390 Urinary tract infection, site not specified: Secondary | ICD-10-CM | POA: Diagnosis not present

## 2014-08-21 DIAGNOSIS — G8929 Other chronic pain: Secondary | ICD-10-CM | POA: Diagnosis not present

## 2014-08-21 DIAGNOSIS — E039 Hypothyroidism, unspecified: Secondary | ICD-10-CM | POA: Diagnosis not present

## 2014-08-23 DIAGNOSIS — Z79899 Other long term (current) drug therapy: Secondary | ICD-10-CM | POA: Diagnosis not present

## 2014-09-06 DIAGNOSIS — N3021 Other chronic cystitis with hematuria: Secondary | ICD-10-CM | POA: Diagnosis not present

## 2014-09-06 DIAGNOSIS — I2510 Atherosclerotic heart disease of native coronary artery without angina pectoris: Secondary | ICD-10-CM | POA: Diagnosis not present

## 2014-09-06 DIAGNOSIS — E039 Hypothyroidism, unspecified: Secondary | ICD-10-CM | POA: Diagnosis not present

## 2014-09-06 DIAGNOSIS — R319 Hematuria, unspecified: Secondary | ICD-10-CM | POA: Diagnosis not present

## 2014-09-06 DIAGNOSIS — D509 Iron deficiency anemia, unspecified: Secondary | ICD-10-CM | POA: Diagnosis not present

## 2014-09-06 DIAGNOSIS — N390 Urinary tract infection, site not specified: Secondary | ICD-10-CM | POA: Diagnosis not present

## 2014-09-06 DIAGNOSIS — N3020 Other chronic cystitis without hematuria: Secondary | ICD-10-CM | POA: Diagnosis not present

## 2014-09-06 DIAGNOSIS — I10 Essential (primary) hypertension: Secondary | ICD-10-CM | POA: Diagnosis not present

## 2014-09-06 DIAGNOSIS — N281 Cyst of kidney, acquired: Secondary | ICD-10-CM | POA: Diagnosis not present

## 2014-09-20 DIAGNOSIS — J432 Centrilobular emphysema: Secondary | ICD-10-CM | POA: Diagnosis not present

## 2014-09-20 DIAGNOSIS — G8929 Other chronic pain: Secondary | ICD-10-CM | POA: Diagnosis not present

## 2014-09-20 DIAGNOSIS — M549 Dorsalgia, unspecified: Secondary | ICD-10-CM | POA: Diagnosis not present

## 2014-10-02 ENCOUNTER — Telehealth: Payer: Self-pay | Admitting: Critical Care Medicine

## 2014-10-02 NOTE — Telephone Encounter (Signed)
Pt is no longer using her oxygen. They would like for her oxygen to be discontinued.  PW - please advise. Thanks.

## 2014-10-02 NOTE — Telephone Encounter (Signed)
lmtcb X1 for pt to make aware of recs.  Will order ono after speaking to pt.

## 2014-10-02 NOTE — Telephone Encounter (Signed)
Repeat ONO on RA to see how her nighttime oxygen set up

## 2014-10-03 NOTE — Telephone Encounter (Signed)
LMTCB x2  

## 2014-10-03 NOTE — Telephone Encounter (Signed)
Spoke with pt's husband and he clarified that pt is still using oxygen concentrator every night .  He states that they have several portable tanks that were sent out when pt was first set up and pt has never used them and they were wanting them picked up.  Informed him that those tanks are sent out to use as a back up in case of power outage emergency.  He verbalized understanding and states they will keep these tanks.  Nothing further needed.

## 2014-10-15 DIAGNOSIS — Z79899 Other long term (current) drug therapy: Secondary | ICD-10-CM | POA: Diagnosis not present

## 2014-11-05 ENCOUNTER — Ambulatory Visit (INDEPENDENT_AMBULATORY_CARE_PROVIDER_SITE_OTHER): Payer: Self-pay | Admitting: Critical Care Medicine

## 2014-11-05 ENCOUNTER — Encounter: Payer: Self-pay | Admitting: Critical Care Medicine

## 2014-11-05 VITALS — BP 128/60 | HR 84 | Temp 96.0°F | Ht 65.0 in

## 2014-11-05 DIAGNOSIS — J9611 Chronic respiratory failure with hypoxia: Secondary | ICD-10-CM | POA: Diagnosis not present

## 2014-11-05 DIAGNOSIS — J432 Centrilobular emphysema: Secondary | ICD-10-CM | POA: Diagnosis not present

## 2014-11-05 NOTE — Progress Notes (Signed)
Subjective:    Patient ID: Cheyenne [name], female    DOB: 1940/11/14, 74 y.o.   MRN: [id number]  HPI  11/05/2014 Chief Complaint  Patient presents with  . Follow-up    sob same.Occass. wheezing on exhalation. No pnd. No cough.  One fall since last OV.  No cough , dyspnea is same.  Notes mild wheeze, if exhales.   Pt denies any significant sore throat, nasal congestion or excess secretions, fever, chills, sweats, unintended weight loss, pleurtic or exertional chest pain, orthopnea PND, or leg swelling Pt denies any increase in rescue therapy over baseline, denies waking up needing it or having any early am or nocturnal exacerbations of coughing/wheezing/or dyspnea. Pt also denies any obvious fluctuation in symptoms with  weather or environmental change or other alleviating or aggravating factors  Pt only using oxygen 2L qhs.  Not using exertional oxygen.    On spiriva respimat and helps  Review of Systems Constitutional:   No  weight loss, night sweats,  Fevers, chills, fatigue, lassitude. HEENT:   No headaches,  Difficulty swallowing,  Tooth/dental problems,  Sore throat,                No sneezing, itching, ear ache, nasal congestion, post nasal drip,   CV:  No chest pain,  Orthopnea, PND, swelling in lower extremities, anasarca, dizziness, palpitations  GI  No heartburn, indigestion, abdominal pain, nausea, vomiting, diarrhea, change in bowel habits, loss of appetite  Resp: Notes  [name] with exertion not at rest.  No excess mucus, no productive cough,  No non-productive cough,  No coughing up of blood.  No change in color of mucus.  No wheezing.  No chest wall deformity  Skin: no rash or lesions.  GU: no dysuria, change in color of urine, no urgency or frequency.  No flank pain.  MS:  Severe chronic back pain   Psych:  No change in mood or affect. No depression or anxiety.  No memory loss.     Objective:   Physical Exam Filed Vitals:   11/05/14 1245 11/05/14  1251  BP: 128/60   Pulse: 91 84  Temp: 96 F (35.6 C)   TempSrc: Oral   Height: 5\' 5"  (1.651 m)   SpO2: 88% 92%    Gen: Pleasant, well-nourished, in no distress,  normal affect  ENT: No lesions,  mouth clear,  oropharynx clear, no postnasal drip  Neck: No JVD, no TMG, no carotid bruits  Lungs: No use of accessory muscles, no dullness to percussion, distant BS  Cardiovascular: RRR, heart sounds normal, no murmur or gallops, no peripheral edema  Abdomen: soft and NT, no HSM,  BS normal  Musculoskeletal: No deformities, no cyanosis or clubbing  Neuro: alert, non focal  Skin: Warm, no lesions or rashes  No results found. 05/2014 CT Chest : nodule gone , emphysematous changes, no further CT Chest needed    Assessment & Plan:   COPD with emphysema Gold C Copd gold C stable at present but not using enough oxygen  Plan Use oxygen 24/7 at home and will obtain light weight oxygen for out of home use 2L No change in medications Return 4 months   Chronic respiratory failure Chronic resp hypoxemic failure Plan Needs oxygen 24/7 2 L This patient continues to use and benefit from her oxygen therapy and has re qualified at this visit for continued oxygen therapy      Updated Medication List Outpatient Encounter Prescriptions as of 11/05/2014  Medication Sig  . Calcium Carbonate-Vitamin D (CALCIUM 600+D) 600-400 MG-UNIT per tablet Take 1 tablet by mouth daily.  . cephALEXin (KEFLEX) 250 MG capsule Take 250 mg by mouth daily.  . Cholecalciferol 1000 UNITS capsule Take 1,000 Units by mouth daily.  . cloNIDine (CATAPRES) 0.1 MG tablet Take 0.1 mg by mouth daily.  . DULoxetine (CYMBALTA) 30 MG capsule Take 30 mg by mouth. Take 1 capsule three times  A day.  . folic acid (FOLVITE) 1 MG tablet Take 1 mg by mouth daily.  Marland Kitchen levETIRAcetam (KEPPRA) 1000 MG tablet Take by mouth. 1000 mg in the morning and 2000mg  at night  . levothyroxine (SYNTHROID, LEVOTHROID) 50 MCG tablet Take 50  mcg by mouth daily before breakfast.  . Multiple Vitamin (MULTIVITAMIN) tablet Take 1 tablet by mouth daily.  . naproxen sodium (ALEVE) 220 MG tablet Take by mouth. Takes 2  in the morning, 2 at noon, 2 in the evening,  . nitroGLYCERIN (NITROSTAT) 0.4 MG SL tablet Place 0.4 mg under the tongue every 5 (five) minutes as needed for chest pain.  Marland Kitchen oxyCODONE-acetaminophen (PERCOCET) 10-325 MG per tablet Take by mouth. 1 tablet in the morning, 1/2 tablet at noon and in the evening, and 1 tablet at bedtime.  . pantoprazole (PROTONIX) 40 MG tablet Take 40 mg by mouth daily.  . pregabalin (LYRICA) 50 MG capsule Take 75 mg by mouth 3 (three) times daily.   . Thiamine HCl (VITAMIN B-1 PO) Take 0.5 tablets by mouth 2 (two) times daily.  . Tiotropium Bromide Monohydrate (SPIRIVA RESPIMAT) 2.5 MCG/ACT AERS Inhale 2 puffs into the lungs daily.  . vitamin B-12 (CYANOCOBALAMIN) 100 MCG tablet Take 500 mcg by mouth daily.   . vitamin C (ASCORBIC ACID) 500 MG tablet Take 500 mg by mouth 2 (two) times daily.

## 2014-11-05 NOTE — Patient Instructions (Signed)
Use oxygen 24/7 at home and will obtain light weight oxygen for out of home use 2L No change in medications Return 4 months

## 2014-11-06 NOTE — Assessment & Plan Note (Signed)
Chronic resp hypoxemic failure Plan Needs oxygen 24/7 2 L This patient continues to use and benefit from her oxygen therapy and has re qualified at this visit for continued oxygen therapy

## 2014-11-06 NOTE — Assessment & Plan Note (Addendum)
Copd gold C stable at present but not using enough oxygen  Note Nodule seen previously 11/2013 now GONE 05/2014 Plan Use oxygen 24/7 at home and will obtain light weight oxygen for out of home use 2L No change in medications Return 4 months

## 2014-11-15 DIAGNOSIS — G8929 Other chronic pain: Secondary | ICD-10-CM | POA: Diagnosis not present

## 2014-11-15 DIAGNOSIS — M549 Dorsalgia, unspecified: Secondary | ICD-10-CM | POA: Diagnosis not present

## 2014-11-28 DIAGNOSIS — N281 Cyst of kidney, acquired: Secondary | ICD-10-CM | POA: Diagnosis not present

## 2014-11-28 DIAGNOSIS — D649 Anemia, unspecified: Secondary | ICD-10-CM | POA: Diagnosis not present

## 2014-11-28 DIAGNOSIS — I129 Hypertensive chronic kidney disease with stage 1 through stage 4 chronic kidney disease, or unspecified chronic kidney disease: Secondary | ICD-10-CM | POA: Diagnosis not present

## 2014-11-28 DIAGNOSIS — Z9689 Presence of other specified functional implants: Secondary | ICD-10-CM | POA: Diagnosis not present

## 2014-11-28 DIAGNOSIS — G8929 Other chronic pain: Secondary | ICD-10-CM | POA: Diagnosis not present

## 2014-11-28 DIAGNOSIS — Z86718 Personal history of other venous thrombosis and embolism: Secondary | ICD-10-CM | POA: Diagnosis not present

## 2014-11-28 DIAGNOSIS — E785 Hyperlipidemia, unspecified: Secondary | ICD-10-CM | POA: Diagnosis not present

## 2014-11-28 DIAGNOSIS — Z9861 Coronary angioplasty status: Secondary | ICD-10-CM | POA: Diagnosis not present

## 2014-11-28 DIAGNOSIS — I2510 Atherosclerotic heart disease of native coronary artery without angina pectoris: Secondary | ICD-10-CM | POA: Diagnosis not present

## 2014-11-28 DIAGNOSIS — I10 Essential (primary) hypertension: Secondary | ICD-10-CM | POA: Diagnosis not present

## 2014-11-28 DIAGNOSIS — M545 Low back pain: Secondary | ICD-10-CM | POA: Diagnosis not present

## 2014-11-28 DIAGNOSIS — N183 Chronic kidney disease, stage 3 (moderate): Secondary | ICD-10-CM | POA: Diagnosis not present

## 2014-11-28 DIAGNOSIS — Z87440 Personal history of urinary (tract) infections: Secondary | ICD-10-CM | POA: Diagnosis not present

## 2014-11-29 DIAGNOSIS — N390 Urinary tract infection, site not specified: Secondary | ICD-10-CM | POA: Diagnosis not present

## 2014-12-02 DIAGNOSIS — I2510 Atherosclerotic heart disease of native coronary artery without angina pectoris: Secondary | ICD-10-CM | POA: Diagnosis not present

## 2014-12-09 DIAGNOSIS — H26493 Other secondary cataract, bilateral: Secondary | ICD-10-CM | POA: Diagnosis not present

## 2014-12-11 DIAGNOSIS — I6529 Occlusion and stenosis of unspecified carotid artery: Secondary | ICD-10-CM | POA: Diagnosis not present

## 2014-12-13 DIAGNOSIS — M549 Dorsalgia, unspecified: Secondary | ICD-10-CM | POA: Diagnosis not present

## 2014-12-13 DIAGNOSIS — G8929 Other chronic pain: Secondary | ICD-10-CM | POA: Diagnosis not present

## 2014-12-13 DIAGNOSIS — J432 Centrilobular emphysema: Secondary | ICD-10-CM | POA: Diagnosis not present

## 2014-12-13 DIAGNOSIS — Z0000 Encounter for general adult medical examination without abnormal findings: Secondary | ICD-10-CM | POA: Diagnosis not present

## 2014-12-18 DIAGNOSIS — N390 Urinary tract infection, site not specified: Secondary | ICD-10-CM | POA: Diagnosis not present

## 2014-12-18 DIAGNOSIS — N3000 Acute cystitis without hematuria: Secondary | ICD-10-CM | POA: Diagnosis not present

## 2014-12-18 DIAGNOSIS — J069 Acute upper respiratory infection, unspecified: Secondary | ICD-10-CM | POA: Diagnosis not present

## 2014-12-18 DIAGNOSIS — E039 Hypothyroidism, unspecified: Secondary | ICD-10-CM | POA: Diagnosis not present

## 2014-12-24 DIAGNOSIS — M47816 Spondylosis without myelopathy or radiculopathy, lumbar region: Secondary | ICD-10-CM | POA: Diagnosis not present

## 2014-12-24 DIAGNOSIS — M961 Postlaminectomy syndrome, not elsewhere classified: Secondary | ICD-10-CM | POA: Diagnosis not present

## 2015-01-08 DIAGNOSIS — Z79899 Other long term (current) drug therapy: Secondary | ICD-10-CM | POA: Diagnosis not present

## 2015-01-08 DIAGNOSIS — M549 Dorsalgia, unspecified: Secondary | ICD-10-CM | POA: Diagnosis not present

## 2015-01-08 DIAGNOSIS — J432 Centrilobular emphysema: Secondary | ICD-10-CM | POA: Diagnosis not present

## 2015-01-08 DIAGNOSIS — G8929 Other chronic pain: Secondary | ICD-10-CM | POA: Diagnosis not present

## 2015-01-18 DIAGNOSIS — J449 Chronic obstructive pulmonary disease, unspecified: Secondary | ICD-10-CM | POA: Diagnosis not present

## 2015-01-18 DIAGNOSIS — R079 Chest pain, unspecified: Secondary | ICD-10-CM | POA: Diagnosis not present

## 2015-01-18 DIAGNOSIS — I10 Essential (primary) hypertension: Secondary | ICD-10-CM | POA: Diagnosis not present

## 2015-01-18 DIAGNOSIS — E039 Hypothyroidism, unspecified: Secondary | ICD-10-CM | POA: Diagnosis not present

## 2015-01-29 DIAGNOSIS — Z87891 Personal history of nicotine dependence: Secondary | ICD-10-CM | POA: Diagnosis not present

## 2015-01-29 DIAGNOSIS — N3020 Other chronic cystitis without hematuria: Secondary | ICD-10-CM | POA: Diagnosis not present

## 2015-01-29 DIAGNOSIS — Z6828 Body mass index (BMI) 28.0-28.9, adult: Secondary | ICD-10-CM | POA: Diagnosis not present

## 2015-01-29 DIAGNOSIS — N3090 Cystitis, unspecified without hematuria: Secondary | ICD-10-CM | POA: Diagnosis not present

## 2015-02-17 DIAGNOSIS — J449 Chronic obstructive pulmonary disease, unspecified: Secondary | ICD-10-CM | POA: Diagnosis not present

## 2015-02-17 DIAGNOSIS — M545 Low back pain: Secondary | ICD-10-CM | POA: Diagnosis not present

## 2015-02-17 DIAGNOSIS — I10 Essential (primary) hypertension: Secondary | ICD-10-CM | POA: Diagnosis not present

## 2015-02-17 DIAGNOSIS — R079 Chest pain, unspecified: Secondary | ICD-10-CM | POA: Diagnosis not present

## 2015-02-19 DIAGNOSIS — J432 Centrilobular emphysema: Secondary | ICD-10-CM | POA: Diagnosis not present

## 2015-03-04 ENCOUNTER — Telehealth: Payer: Self-pay | Admitting: Critical Care Medicine

## 2015-03-04 DIAGNOSIS — J449 Chronic obstructive pulmonary disease, unspecified: Secondary | ICD-10-CM | POA: Diagnosis not present

## 2015-03-04 NOTE — Telephone Encounter (Signed)
Spoke with Dr Truman Hayward at Urgent [facility] in [place] Pt being seen for sudden onset of SOB - walked in [place] at 83% on RA. Placed on 2L O2 and was able to maintain O2 at 89-90%. Dr Truman Hayward states that the patient is coughing and sounds restricted and "cackly", feels that she needs to go to ED. Wanting to know the patient's baseline on 2L O2 - explained that the O2 saturation is going to vary based on her current symptoms and that there is not a set range that she is going to sat on 2 Liters. Explained that in our office that if the patient O2 is not maintained on 2 Liters O2 and is dropping below 88% then we increase the O2 to 3 Liters and then titrate from there based on how they are able to tolerate and maintain their levels. Explained that at the last OV the recorded O2 sat we have the patient was at 89% on 2 liters O2 which is "okay"  because she is above 88%. Dr Truman Hayward also asking about her diagnosis - COPD w/emphysema GOLD C - Dr Truman Hayward states that he is just going to send the patient on to the ED based on her lungs sounds. Advised that I would relay information to Dr Joya Gaskins.

## 2015-03-06 NOTE — Telephone Encounter (Signed)
lmomtcb x1 for pt 

## 2015-03-06 NOTE — Telephone Encounter (Signed)
Need to find out what happened and if not admitted, have her added on to [facility] schedule next week

## 2015-03-07 NOTE — Telephone Encounter (Signed)
lmtcb for pt.  

## 2015-03-10 ENCOUNTER — Ambulatory Visit: Payer: Self-pay | Admitting: Critical Care Medicine

## 2015-03-10 NOTE — Telephone Encounter (Signed)
Noted  

## 2015-03-10 NOTE — Telephone Encounter (Signed)
Patient is at home.  Patient is in the bed, she cannot do too much without getting SOB.  She says that her O2 is staying around 90% on 2L at rest. Patient says she never went to the hospital. Patient has appointment with PW in [place] tomorrow at 2:45.   Patient reminded of appointment.  FYI to PW

## 2015-03-11 ENCOUNTER — Encounter: Payer: Self-pay | Admitting: Critical Care Medicine

## 2015-03-11 ENCOUNTER — Ambulatory Visit (INDEPENDENT_AMBULATORY_CARE_PROVIDER_SITE_OTHER): Payer: Self-pay | Admitting: Critical Care Medicine

## 2015-03-11 VITALS — BP 120/74 | HR 78 | Temp 99.2°F | Ht 65.0 in | Wt 150.0 lb

## 2015-03-11 DIAGNOSIS — J9611 Chronic respiratory failure with hypoxia: Secondary | ICD-10-CM | POA: Diagnosis not present

## 2015-03-11 DIAGNOSIS — J432 Centrilobular emphysema: Secondary | ICD-10-CM

## 2015-03-11 MED ORDER — ALBUTEROL SULFATE 108 (90 BASE) MCG/ACT IN AEPB: 2.0000 | INHALATION_SPRAY | Freq: Four times a day (QID) | RESPIRATORY_TRACT | Status: DC | PRN

## 2015-03-11 MED ORDER — UMECLIDINIUM-VILANTEROL 62.5-25 MCG/INH IN AEPB: 1.0000 | INHALATION_SPRAY | Freq: Every day | RESPIRATORY_TRACT | Status: DC

## 2015-03-11 NOTE — Progress Notes (Signed)
Subjective:    Patient ID: Cheyenne [name], female    DOB: 1941-05-24, 74 y.o.   MRN: [id number]  HPI 03/11/2015 Chief Complaint  Patient presents with  . 4 month follow up    Feels breathing has worsened over the past wk - increased SOB with minimal exertion.  Some chest tightness.  Minimal nonprod cough.     Notes more dyspnea. No real cough. No mucus. No fever. Notes chest pain: dull ache assoc with dyspnea and worse with exertion Pt denies any significant sore throat, nasal congestion or excess secretions, fever, chills, sweats, unintended weight loss,  orthopnea PND, or leg swelling Pt notes more use of  rescue therapy over baseline, pt notes is waking up  having any early am or nocturnal exacerbations of coughing/wheezing and dyspnea. Ptnotes  fluctuation in symptoms with  weather   Current Medications, Allergies, Complete Past Medical History, Past Surgical History, Family History, and Social History were reviewed in [place] record per todays encounter:  03/11/2015  Review of Systems  Constitutional: Positive for fatigue. Negative for fever.  HENT: Negative.  Negative for ear pain, postnasal drip, rhinorrhea, sinus pressure, sore throat, trouble swallowing and voice change.   Eyes: Negative.   Respiratory: Positive for chest tightness, [name] and wheezing. Negative for apnea, cough, choking and stridor.   Cardiovascular: Positive for chest pain. Negative for palpitations and leg swelling.  Gastrointestinal: Negative.  Negative for nausea, vomiting, abdominal pain and abdominal distention.  Genitourinary: Negative.   Musculoskeletal: Negative.  Negative for myalgias and arthralgias.  Skin: Negative.  Negative for rash.  Allergic/Immunologic: Negative.  Negative for environmental allergies and food allergies.  Neurological: Negative.  Negative for dizziness, syncope, weakness and headaches.  Hematological: Negative.  Negative for adenopathy.  Does not bruise/bleed easily.  Psychiatric/Behavioral: Negative.  Negative for sleep disturbance and agitation. The patient is not nervous/anxious.   All other systems reviewed and are negative.      Objective:   Physical Exam Filed Vitals:   03/11/15 1509  BP: 120/74  Pulse: 78  Temp: 99.2 F (37.3 C)  TempSrc: Oral  Height: '5\' 5"'$  (1.651 m)  Weight: 150 lb (68.04 kg)  SpO2: 91%    Gen: Pleasant, well-nourished, in no distress,  normal affect  ENT: No lesions,  mouth clear,  oropharynx clear, no postnasal drip  Neck: No JVD, no TMG, no carotid bruits  Lungs: No use of accessory muscles, no dullness to percussion, distant BS  Cardiovascular: RRR, heart sounds normal, no murmur or gallops, no peripheral edema  Abdomen: soft and NT, no HSM,  BS normal  Musculoskeletal: No deformities, no cyanosis or clubbing  Neuro: alert, non focal  Skin: Warm, no lesions or rashes  No results found.     Assessment & Plan:  I personally reviewed all images and lab data in the [facility] system as well as any outside material available during this office visit and agree with the  radiology impressions.   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  Diagnoses and all orders for this visit:  Centrilobular emphysema  Chronic respiratory failure with hypoxia  Other orders -     Umeclidinium-Vilanterol (ANORO ELLIPTA) 62.5-25 MCG/INH AEPB; Inhale 1  puff into the lungs daily. -     Albuterol Sulfate (PROAIR RESPICLICK) 956 (90 BASE) MCG/ACT AEPB; Inhale 2 puffs into the lungs every 6 (six) hours as needed.

## 2015-03-11 NOTE — Patient Instructions (Addendum)
Stop spiriva Start ANORO one puff daily Use Proair respiclik 2puff 4 times daily as needed Stay on oxygen  Return 4 weeks

## 2015-03-12 NOTE — Assessment & Plan Note (Signed)
Copd with emphysema Gold C Need more bronchodilation at present Plan Stop spiriva Start ANORO one puff daily Use Proair respiclik 2puff 4 times daily as needed Stay on oxygen  Return 4 weeks

## 2015-03-20 DIAGNOSIS — K219 Gastro-esophageal reflux disease without esophagitis: Secondary | ICD-10-CM | POA: Diagnosis not present

## 2015-03-20 DIAGNOSIS — G8929 Other chronic pain: Secondary | ICD-10-CM | POA: Diagnosis not present

## 2015-03-20 DIAGNOSIS — E039 Hypothyroidism, unspecified: Secondary | ICD-10-CM | POA: Diagnosis not present

## 2015-03-21 DIAGNOSIS — M549 Dorsalgia, unspecified: Secondary | ICD-10-CM | POA: Diagnosis not present

## 2015-03-21 DIAGNOSIS — Z23 Encounter for immunization: Secondary | ICD-10-CM | POA: Diagnosis not present

## 2015-03-21 DIAGNOSIS — G8929 Other chronic pain: Secondary | ICD-10-CM | POA: Diagnosis not present

## 2015-03-21 DIAGNOSIS — J432 Centrilobular emphysema: Secondary | ICD-10-CM | POA: Diagnosis not present

## 2015-04-01 ENCOUNTER — Ambulatory Visit (INDEPENDENT_AMBULATORY_CARE_PROVIDER_SITE_OTHER): Payer: Self-pay | Admitting: Critical Care Medicine

## 2015-04-01 ENCOUNTER — Encounter: Payer: Self-pay | Admitting: Critical Care Medicine

## 2015-04-01 VITALS — BP 120/60 | HR 80 | Temp 97.4°F | Ht 65.0 in | Wt 176.4 lb

## 2015-04-01 DIAGNOSIS — J449 Chronic obstructive pulmonary disease, unspecified: Secondary | ICD-10-CM

## 2015-04-01 DIAGNOSIS — J9611 Chronic respiratory failure with hypoxia: Secondary | ICD-10-CM

## 2015-04-01 DIAGNOSIS — J432 Centrilobular emphysema: Secondary | ICD-10-CM | POA: Diagnosis not present

## 2015-04-01 NOTE — Assessment & Plan Note (Signed)
Gold C Copd improved on Anoro Plan Cont inhaled meds Cont oxygen

## 2015-04-01 NOTE — Progress Notes (Signed)
   Subjective:    Patient ID: Cheyenne Cheyenne [name], female    DOB: September 17, 1940, 74 y.o.   MRN: [id number]  HPI 04/01/2015 Chief Complaint  Patient presents with  . Follow-up    Wearing oxygen 2L cont.,dry cough occass.,midchest pain off/on after greasy meal,pnd,no fcs,wheezing   Pt with chronic back pain., wheelchair bound.  resp symptoms unchanged Pt notes dyspnea at rest and with minimal exertion Notes ongoing back pain   Current Medications, Allergies, Complete Past Medical History, Past Surgical History, Family History, and Social History were reviewed in [place] record per todays encounter:  04/01/2015  Review of Systems  Constitutional: Negative.   HENT: Negative.  Negative for ear pain, postnasal drip, rhinorrhea, sinus pressure, sore throat, trouble swallowing and voice change.   Eyes: Negative.   Respiratory: Positive for cough and shortness of Cheyenne [name]. Negative for apnea, choking, chest tightness, wheezing and stridor.   Cardiovascular: Negative.  Negative for chest pain, palpitations and leg swelling.  Gastrointestinal: Negative.  Negative for nausea, vomiting, abdominal pain and abdominal distention.  Genitourinary: Negative.   Musculoskeletal: Positive for back pain and gait problem. Negative for myalgias and arthralgias.  Skin: Negative.  Negative for rash.  Allergic/Immunologic: Negative.  Negative for environmental allergies and food allergies.  Neurological: Negative.  Negative for dizziness, syncope, weakness and headaches.  Hematological: Negative.  Negative for adenopathy. Does not bruise/bleed easily.  Psychiatric/Behavioral: Negative.  Negative for sleep disturbance and agitation. The patient is not nervous/anxious.        Objective:   Physical Exam Filed Vitals:   04/01/15 1450  BP: 120/60  Pulse: 80  Temp: 97.4 F (36.3 C)  TempSrc: Oral  Height: '5\' 5"'$  (1.651 m)  SpO2: 90%    Gen: Pleasant, well-nourished, in no distress,  normal  affect, sitting in wheelchair  ENT: No lesions,  mouth clear,  oropharynx clear, no postnasal drip  Neck: No JVD, no TMG, no carotid bruits  Lungs: No use of accessory muscles, no dullness to percussion,distant bs  Cardiovascular: RRR, heart sounds normal, no murmur or gallops, no peripheral edema  Abdomen: soft and NT, no HSM,  BS normal  Musculoskeletal: No deformities, no cyanosis or clubbing  Neuro: alert, non focal  Skin: Warm, no lesions or rashes  No results found.        Assessment & Plan:  I personally reviewed all images and lab data in the [facility] system as well as any outside material available during this office visit and agree with the  radiology impressions.   COPD with emphysema Gold C Gold C Copd improved on Anoro Plan Cont inhaled meds Cont oxygen    Chronic respiratory failure Chronic hypoxic resp failure Oxygen dependent On RA at rest sats 82%  This patient continues to use and benefit from her oxygen therapy and has re qualified at this visit for continued oxygen therapy Plan Cont oxygen 2L    I had an extended discussion with the patient and or family lasting 10 minutes of a 25 minute visit including:  Dx, tx options, need for oxygen rx

## 2015-04-01 NOTE — Assessment & Plan Note (Signed)
Chronic hypoxic resp failure Oxygen dependent On RA at rest sats 82%  This patient continues to use and benefit from her oxygen therapy and has re qualified at this visit for continued oxygen therapy Plan Cont oxygen 2L

## 2015-04-01 NOTE — Patient Instructions (Signed)
Stay on oxygen Stay on Anoro, deep slow breath to inhale medication Return 4 months with Dr Cheyenne [name]

## 2015-04-14 DIAGNOSIS — E039 Hypothyroidism, unspecified: Secondary | ICD-10-CM | POA: Diagnosis not present

## 2015-04-14 DIAGNOSIS — Z96643 Presence of artificial hip joint, bilateral: Secondary | ICD-10-CM | POA: Diagnosis not present

## 2015-04-14 DIAGNOSIS — Z9981 Dependence on supplemental oxygen: Secondary | ICD-10-CM | POA: Diagnosis not present

## 2015-04-14 DIAGNOSIS — I2510 Atherosclerotic heart disease of native coronary artery without angina pectoris: Secondary | ICD-10-CM | POA: Diagnosis not present

## 2015-04-14 DIAGNOSIS — N3001 Acute cystitis with hematuria: Secondary | ICD-10-CM | POA: Diagnosis not present

## 2015-04-14 DIAGNOSIS — N390 Urinary tract infection, site not specified: Secondary | ICD-10-CM | POA: Diagnosis not present

## 2015-04-14 DIAGNOSIS — I10 Essential (primary) hypertension: Secondary | ICD-10-CM | POA: Diagnosis not present

## 2015-04-14 DIAGNOSIS — R4182 Altered mental status, unspecified: Secondary | ICD-10-CM | POA: Diagnosis not present

## 2015-04-14 DIAGNOSIS — T50901A Poisoning by unspecified drugs, medicaments and biological substances, accidental (unintentional), initial encounter: Secondary | ICD-10-CM | POA: Diagnosis not present

## 2015-04-14 DIAGNOSIS — Z8673 Personal history of transient ischemic attack (TIA), and cerebral infarction without residual deficits: Secondary | ICD-10-CM | POA: Diagnosis not present

## 2015-04-14 DIAGNOSIS — K219 Gastro-esophageal reflux disease without esophagitis: Secondary | ICD-10-CM | POA: Diagnosis not present

## 2015-04-14 DIAGNOSIS — Z79899 Other long term (current) drug therapy: Secondary | ICD-10-CM | POA: Diagnosis not present

## 2015-04-14 DIAGNOSIS — E780 Pure hypercholesterolemia: Secondary | ICD-10-CM | POA: Diagnosis not present

## 2015-04-14 DIAGNOSIS — R5383 Other fatigue: Secondary | ICD-10-CM | POA: Diagnosis not present

## 2015-04-18 DIAGNOSIS — G8929 Other chronic pain: Secondary | ICD-10-CM | POA: Diagnosis not present

## 2015-04-18 DIAGNOSIS — M549 Dorsalgia, unspecified: Secondary | ICD-10-CM | POA: Diagnosis not present

## 2015-04-30 DIAGNOSIS — Z79899 Other long term (current) drug therapy: Secondary | ICD-10-CM | POA: Diagnosis not present

## 2015-04-30 DIAGNOSIS — R404 Transient alteration of awareness: Secondary | ICD-10-CM | POA: Diagnosis not present

## 2015-04-30 DIAGNOSIS — Z96643 Presence of artificial hip joint, bilateral: Secondary | ICD-10-CM | POA: Diagnosis not present

## 2015-04-30 DIAGNOSIS — E7800 Pure hypercholesterolemia, unspecified: Secondary | ICD-10-CM | POA: Diagnosis not present

## 2015-04-30 DIAGNOSIS — I2510 Atherosclerotic heart disease of native coronary artery without angina pectoris: Secondary | ICD-10-CM | POA: Diagnosis not present

## 2015-04-30 DIAGNOSIS — E039 Hypothyroidism, unspecified: Secondary | ICD-10-CM | POA: Diagnosis not present

## 2015-04-30 DIAGNOSIS — R0602 Shortness of breath: Secondary | ICD-10-CM | POA: Diagnosis not present

## 2015-04-30 DIAGNOSIS — I674 Hypertensive encephalopathy: Secondary | ICD-10-CM | POA: Diagnosis not present

## 2015-04-30 DIAGNOSIS — K219 Gastro-esophageal reflux disease without esophagitis: Secondary | ICD-10-CM | POA: Diagnosis not present

## 2015-04-30 DIAGNOSIS — R531 Weakness: Secondary | ICD-10-CM | POA: Diagnosis not present

## 2015-04-30 DIAGNOSIS — R410 Disorientation, unspecified: Secondary | ICD-10-CM | POA: Diagnosis not present

## 2015-05-01 DIAGNOSIS — A047 Enterocolitis due to Clostridium difficile: Secondary | ICD-10-CM | POA: Diagnosis not present

## 2015-05-01 DIAGNOSIS — R5383 Other fatigue: Secondary | ICD-10-CM | POA: Diagnosis not present

## 2015-05-01 DIAGNOSIS — I6529 Occlusion and stenosis of unspecified carotid artery: Secondary | ICD-10-CM | POA: Diagnosis not present

## 2015-05-01 DIAGNOSIS — R410 Disorientation, unspecified: Secondary | ICD-10-CM | POA: Diagnosis not present

## 2015-05-01 DIAGNOSIS — I10 Essential (primary) hypertension: Secondary | ICD-10-CM | POA: Diagnosis not present

## 2015-05-01 DIAGNOSIS — R9431 Abnormal electrocardiogram [ECG] [EKG]: Secondary | ICD-10-CM | POA: Diagnosis not present

## 2015-05-01 DIAGNOSIS — R630 Anorexia: Secondary | ICD-10-CM | POA: Diagnosis not present

## 2015-05-01 DIAGNOSIS — I2510 Atherosclerotic heart disease of native coronary artery without angina pectoris: Secondary | ICD-10-CM | POA: Diagnosis not present

## 2015-05-01 DIAGNOSIS — R531 Weakness: Secondary | ICD-10-CM | POA: Diagnosis not present

## 2015-05-01 DIAGNOSIS — E785 Hyperlipidemia, unspecified: Secondary | ICD-10-CM | POA: Diagnosis not present

## 2015-05-02 DIAGNOSIS — I10 Essential (primary) hypertension: Secondary | ICD-10-CM | POA: Diagnosis not present

## 2015-05-02 DIAGNOSIS — R531 Weakness: Secondary | ICD-10-CM | POA: Diagnosis not present

## 2015-05-02 DIAGNOSIS — R630 Anorexia: Secondary | ICD-10-CM | POA: Diagnosis not present

## 2015-05-02 DIAGNOSIS — R410 Disorientation, unspecified: Secondary | ICD-10-CM | POA: Diagnosis not present

## 2015-05-02 DIAGNOSIS — R5383 Other fatigue: Secondary | ICD-10-CM | POA: Diagnosis not present

## 2015-05-03 DIAGNOSIS — R110 Nausea: Secondary | ICD-10-CM | POA: Diagnosis not present

## 2015-05-03 DIAGNOSIS — N183 Chronic kidney disease, stage 3 (moderate): Secondary | ICD-10-CM | POA: Diagnosis not present

## 2015-05-04 DIAGNOSIS — I129 Hypertensive chronic kidney disease with stage 1 through stage 4 chronic kidney disease, or unspecified chronic kidney disease: Secondary | ICD-10-CM | POA: Diagnosis present

## 2015-05-04 DIAGNOSIS — R630 Anorexia: Secondary | ICD-10-CM | POA: Diagnosis not present

## 2015-05-04 DIAGNOSIS — R410 Disorientation, unspecified: Secondary | ICD-10-CM | POA: Diagnosis not present

## 2015-05-04 DIAGNOSIS — Z96643 Presence of artificial hip joint, bilateral: Secondary | ICD-10-CM | POA: Diagnosis present

## 2015-05-04 DIAGNOSIS — E860 Dehydration: Secondary | ICD-10-CM | POA: Diagnosis not present

## 2015-05-04 DIAGNOSIS — R5383 Other fatigue: Secondary | ICD-10-CM | POA: Diagnosis not present

## 2015-05-04 DIAGNOSIS — E039 Hypothyroidism, unspecified: Secondary | ICD-10-CM | POA: Diagnosis present

## 2015-05-04 DIAGNOSIS — G40409 Other generalized epilepsy and epileptic syndromes, not intractable, without status epilepticus: Secondary | ICD-10-CM | POA: Diagnosis present

## 2015-05-04 DIAGNOSIS — K219 Gastro-esophageal reflux disease without esophagitis: Secondary | ICD-10-CM | POA: Diagnosis present

## 2015-05-04 DIAGNOSIS — R531 Weakness: Secondary | ICD-10-CM | POA: Diagnosis not present

## 2015-05-04 DIAGNOSIS — Z7409 Other reduced mobility: Secondary | ICD-10-CM | POA: Diagnosis not present

## 2015-05-04 DIAGNOSIS — R110 Nausea: Secondary | ICD-10-CM | POA: Diagnosis not present

## 2015-05-04 DIAGNOSIS — Z87440 Personal history of urinary (tract) infections: Secondary | ICD-10-CM | POA: Diagnosis not present

## 2015-05-04 DIAGNOSIS — I739 Peripheral vascular disease, unspecified: Secondary | ICD-10-CM | POA: Diagnosis present

## 2015-05-04 DIAGNOSIS — I2510 Atherosclerotic heart disease of native coronary artery without angina pectoris: Secondary | ICD-10-CM | POA: Diagnosis present

## 2015-05-04 DIAGNOSIS — E785 Hyperlipidemia, unspecified: Secondary | ICD-10-CM | POA: Diagnosis present

## 2015-05-04 DIAGNOSIS — N170 Acute kidney failure with tubular necrosis: Secondary | ICD-10-CM | POA: Diagnosis present

## 2015-05-04 DIAGNOSIS — Z9981 Dependence on supplemental oxygen: Secondary | ICD-10-CM | POA: Diagnosis not present

## 2015-05-04 DIAGNOSIS — L89152 Pressure ulcer of sacral region, stage 2: Secondary | ICD-10-CM | POA: Diagnosis not present

## 2015-05-04 DIAGNOSIS — R627 Adult failure to thrive: Secondary | ICD-10-CM | POA: Diagnosis present

## 2015-05-04 DIAGNOSIS — I10 Essential (primary) hypertension: Secondary | ICD-10-CM | POA: Diagnosis not present

## 2015-05-04 DIAGNOSIS — D72829 Elevated white blood cell count, unspecified: Secondary | ICD-10-CM | POA: Diagnosis not present

## 2015-05-04 DIAGNOSIS — N179 Acute kidney failure, unspecified: Secondary | ICD-10-CM | POA: Diagnosis not present

## 2015-05-04 DIAGNOSIS — J449 Chronic obstructive pulmonary disease, unspecified: Secondary | ICD-10-CM | POA: Diagnosis present

## 2015-05-04 DIAGNOSIS — K5900 Constipation, unspecified: Secondary | ICD-10-CM | POA: Diagnosis present

## 2015-05-04 DIAGNOSIS — Z66 Do not resuscitate: Secondary | ICD-10-CM | POA: Diagnosis present

## 2015-05-04 DIAGNOSIS — G3184 Mild cognitive impairment, so stated: Secondary | ICD-10-CM | POA: Diagnosis present

## 2015-05-04 DIAGNOSIS — J9611 Chronic respiratory failure with hypoxia: Secondary | ICD-10-CM | POA: Diagnosis present

## 2015-05-04 DIAGNOSIS — Z87891 Personal history of nicotine dependence: Secondary | ICD-10-CM | POA: Diagnosis not present

## 2015-05-04 DIAGNOSIS — N183 Chronic kidney disease, stage 3 (moderate): Secondary | ICD-10-CM | POA: Diagnosis not present

## 2015-05-04 DIAGNOSIS — E876 Hypokalemia: Secondary | ICD-10-CM | POA: Diagnosis not present

## 2015-05-04 DIAGNOSIS — I959 Hypotension, unspecified: Secondary | ICD-10-CM | POA: Diagnosis not present

## 2015-05-04 DIAGNOSIS — N281 Cyst of kidney, acquired: Secondary | ICD-10-CM | POA: Diagnosis not present

## 2015-05-09 DIAGNOSIS — G8929 Other chronic pain: Secondary | ICD-10-CM | POA: Diagnosis not present

## 2015-05-09 DIAGNOSIS — M549 Dorsalgia, unspecified: Secondary | ICD-10-CM | POA: Diagnosis not present

## 2015-05-12 DIAGNOSIS — J449 Chronic obstructive pulmonary disease, unspecified: Secondary | ICD-10-CM | POA: Diagnosis not present

## 2015-05-12 DIAGNOSIS — M6281 Muscle weakness (generalized): Secondary | ICD-10-CM | POA: Diagnosis not present

## 2015-05-12 DIAGNOSIS — M549 Dorsalgia, unspecified: Secondary | ICD-10-CM | POA: Diagnosis not present

## 2015-05-12 DIAGNOSIS — J9610 Chronic respiratory failure, unspecified whether with hypoxia or hypercapnia: Secondary | ICD-10-CM | POA: Diagnosis not present

## 2015-05-12 DIAGNOSIS — I2510 Atherosclerotic heart disease of native coronary artery without angina pectoris: Secondary | ICD-10-CM | POA: Diagnosis not present

## 2015-05-12 DIAGNOSIS — R279 Unspecified lack of coordination: Secondary | ICD-10-CM | POA: Diagnosis not present

## 2015-05-12 DIAGNOSIS — G8929 Other chronic pain: Secondary | ICD-10-CM | POA: Diagnosis not present

## 2015-05-12 DIAGNOSIS — I129 Hypertensive chronic kidney disease with stage 1 through stage 4 chronic kidney disease, or unspecified chronic kidney disease: Secondary | ICD-10-CM | POA: Diagnosis not present

## 2015-05-12 DIAGNOSIS — G40909 Epilepsy, unspecified, not intractable, without status epilepticus: Secondary | ICD-10-CM | POA: Diagnosis not present

## 2015-05-12 DIAGNOSIS — Z9981 Dependence on supplemental oxygen: Secondary | ICD-10-CM | POA: Diagnosis not present

## 2015-05-12 DIAGNOSIS — N183 Chronic kidney disease, stage 3 (moderate): Secondary | ICD-10-CM | POA: Diagnosis not present

## 2015-05-14 DIAGNOSIS — G8929 Other chronic pain: Secondary | ICD-10-CM | POA: Diagnosis not present

## 2015-05-14 DIAGNOSIS — R279 Unspecified lack of coordination: Secondary | ICD-10-CM | POA: Diagnosis not present

## 2015-05-14 DIAGNOSIS — M6281 Muscle weakness (generalized): Secondary | ICD-10-CM | POA: Diagnosis not present

## 2015-05-14 DIAGNOSIS — I129 Hypertensive chronic kidney disease with stage 1 through stage 4 chronic kidney disease, or unspecified chronic kidney disease: Secondary | ICD-10-CM | POA: Diagnosis not present

## 2015-05-14 DIAGNOSIS — M549 Dorsalgia, unspecified: Secondary | ICD-10-CM | POA: Diagnosis not present

## 2015-05-14 DIAGNOSIS — I2510 Atherosclerotic heart disease of native coronary artery without angina pectoris: Secondary | ICD-10-CM | POA: Diagnosis not present

## 2015-05-15 DIAGNOSIS — R300 Dysuria: Secondary | ICD-10-CM | POA: Diagnosis not present

## 2015-05-16 DIAGNOSIS — I129 Hypertensive chronic kidney disease with stage 1 through stage 4 chronic kidney disease, or unspecified chronic kidney disease: Secondary | ICD-10-CM | POA: Diagnosis not present

## 2015-05-16 DIAGNOSIS — I2510 Atherosclerotic heart disease of native coronary artery without angina pectoris: Secondary | ICD-10-CM | POA: Diagnosis not present

## 2015-05-16 DIAGNOSIS — M549 Dorsalgia, unspecified: Secondary | ICD-10-CM | POA: Diagnosis not present

## 2015-05-16 DIAGNOSIS — G8929 Other chronic pain: Secondary | ICD-10-CM | POA: Diagnosis not present

## 2015-05-16 DIAGNOSIS — R279 Unspecified lack of coordination: Secondary | ICD-10-CM | POA: Diagnosis not present

## 2015-05-16 DIAGNOSIS — M6281 Muscle weakness (generalized): Secondary | ICD-10-CM | POA: Diagnosis not present

## 2015-05-19 DIAGNOSIS — I2510 Atherosclerotic heart disease of native coronary artery without angina pectoris: Secondary | ICD-10-CM | POA: Diagnosis not present

## 2015-05-19 DIAGNOSIS — G8929 Other chronic pain: Secondary | ICD-10-CM | POA: Diagnosis not present

## 2015-05-19 DIAGNOSIS — R279 Unspecified lack of coordination: Secondary | ICD-10-CM | POA: Diagnosis not present

## 2015-05-19 DIAGNOSIS — I129 Hypertensive chronic kidney disease with stage 1 through stage 4 chronic kidney disease, or unspecified chronic kidney disease: Secondary | ICD-10-CM | POA: Diagnosis not present

## 2015-05-19 DIAGNOSIS — M6281 Muscle weakness (generalized): Secondary | ICD-10-CM | POA: Diagnosis not present

## 2015-05-19 DIAGNOSIS — M549 Dorsalgia, unspecified: Secondary | ICD-10-CM | POA: Diagnosis not present

## 2015-05-20 DIAGNOSIS — Z85820 Personal history of malignant melanoma of skin: Secondary | ICD-10-CM | POA: Diagnosis not present

## 2015-05-20 DIAGNOSIS — Z8679 Personal history of other diseases of the circulatory system: Secondary | ICD-10-CM | POA: Diagnosis not present

## 2015-05-20 DIAGNOSIS — J441 Chronic obstructive pulmonary disease with (acute) exacerbation: Secondary | ICD-10-CM | POA: Diagnosis not present

## 2015-05-20 DIAGNOSIS — Z8719 Personal history of other diseases of the digestive system: Secondary | ICD-10-CM | POA: Diagnosis not present

## 2015-05-20 DIAGNOSIS — Z8739 Personal history of other diseases of the musculoskeletal system and connective tissue: Secondary | ICD-10-CM | POA: Diagnosis not present

## 2015-05-20 DIAGNOSIS — N189 Chronic kidney disease, unspecified: Secondary | ICD-10-CM | POA: Diagnosis not present

## 2015-05-20 DIAGNOSIS — Z8639 Personal history of other endocrine, nutritional and metabolic disease: Secondary | ICD-10-CM | POA: Diagnosis not present

## 2015-05-21 DIAGNOSIS — J441 Chronic obstructive pulmonary disease with (acute) exacerbation: Secondary | ICD-10-CM | POA: Diagnosis not present

## 2015-05-21 DIAGNOSIS — Z85820 Personal history of malignant melanoma of skin: Secondary | ICD-10-CM | POA: Diagnosis not present

## 2015-05-21 DIAGNOSIS — Z8679 Personal history of other diseases of the circulatory system: Secondary | ICD-10-CM | POA: Diagnosis not present

## 2015-05-21 DIAGNOSIS — Z8639 Personal history of other endocrine, nutritional and metabolic disease: Secondary | ICD-10-CM | POA: Diagnosis not present

## 2015-05-21 DIAGNOSIS — Z8719 Personal history of other diseases of the digestive system: Secondary | ICD-10-CM | POA: Diagnosis not present

## 2015-05-21 DIAGNOSIS — N189 Chronic kidney disease, unspecified: Secondary | ICD-10-CM | POA: Diagnosis not present

## 2015-05-22 DIAGNOSIS — Z8639 Personal history of other endocrine, nutritional and metabolic disease: Secondary | ICD-10-CM | POA: Diagnosis not present

## 2015-05-22 DIAGNOSIS — J441 Chronic obstructive pulmonary disease with (acute) exacerbation: Secondary | ICD-10-CM | POA: Diagnosis not present

## 2015-05-22 DIAGNOSIS — Z8719 Personal history of other diseases of the digestive system: Secondary | ICD-10-CM | POA: Diagnosis not present

## 2015-05-22 DIAGNOSIS — N189 Chronic kidney disease, unspecified: Secondary | ICD-10-CM | POA: Diagnosis not present

## 2015-05-22 DIAGNOSIS — Z8679 Personal history of other diseases of the circulatory system: Secondary | ICD-10-CM | POA: Diagnosis not present

## 2015-05-22 DIAGNOSIS — Z85820 Personal history of malignant melanoma of skin: Secondary | ICD-10-CM | POA: Diagnosis not present

## 2015-05-23 DIAGNOSIS — Z85820 Personal history of malignant melanoma of skin: Secondary | ICD-10-CM | POA: Diagnosis not present

## 2015-05-23 DIAGNOSIS — J441 Chronic obstructive pulmonary disease with (acute) exacerbation: Secondary | ICD-10-CM | POA: Diagnosis not present

## 2015-05-23 DIAGNOSIS — Z8639 Personal history of other endocrine, nutritional and metabolic disease: Secondary | ICD-10-CM | POA: Diagnosis not present

## 2015-05-23 DIAGNOSIS — Z8679 Personal history of other diseases of the circulatory system: Secondary | ICD-10-CM | POA: Diagnosis not present

## 2015-05-23 DIAGNOSIS — Z8719 Personal history of other diseases of the digestive system: Secondary | ICD-10-CM | POA: Diagnosis not present

## 2015-05-23 DIAGNOSIS — N189 Chronic kidney disease, unspecified: Secondary | ICD-10-CM | POA: Diagnosis not present

## 2015-05-26 DIAGNOSIS — Z85820 Personal history of malignant melanoma of skin: Secondary | ICD-10-CM | POA: Diagnosis not present

## 2015-05-26 DIAGNOSIS — Z8679 Personal history of other diseases of the circulatory system: Secondary | ICD-10-CM | POA: Diagnosis not present

## 2015-05-26 DIAGNOSIS — J441 Chronic obstructive pulmonary disease with (acute) exacerbation: Secondary | ICD-10-CM | POA: Diagnosis not present

## 2015-05-26 DIAGNOSIS — Z8719 Personal history of other diseases of the digestive system: Secondary | ICD-10-CM | POA: Diagnosis not present

## 2015-05-26 DIAGNOSIS — Z8639 Personal history of other endocrine, nutritional and metabolic disease: Secondary | ICD-10-CM | POA: Diagnosis not present

## 2015-05-26 DIAGNOSIS — N189 Chronic kidney disease, unspecified: Secondary | ICD-10-CM | POA: Diagnosis not present

## 2015-05-27 DIAGNOSIS — Z8679 Personal history of other diseases of the circulatory system: Secondary | ICD-10-CM | POA: Diagnosis not present

## 2015-05-27 DIAGNOSIS — Z8739 Personal history of other diseases of the musculoskeletal system and connective tissue: Secondary | ICD-10-CM | POA: Diagnosis not present

## 2015-05-27 DIAGNOSIS — Z8639 Personal history of other endocrine, nutritional and metabolic disease: Secondary | ICD-10-CM | POA: Diagnosis not present

## 2015-05-27 DIAGNOSIS — J441 Chronic obstructive pulmonary disease with (acute) exacerbation: Secondary | ICD-10-CM | POA: Diagnosis not present

## 2015-05-27 DIAGNOSIS — N189 Chronic kidney disease, unspecified: Secondary | ICD-10-CM | POA: Diagnosis not present

## 2015-05-27 DIAGNOSIS — Z85820 Personal history of malignant melanoma of skin: Secondary | ICD-10-CM | POA: Diagnosis not present

## 2015-05-27 DIAGNOSIS — Z8719 Personal history of other diseases of the digestive system: Secondary | ICD-10-CM | POA: Diagnosis not present

## 2015-05-28 DIAGNOSIS — J441 Chronic obstructive pulmonary disease with (acute) exacerbation: Secondary | ICD-10-CM | POA: Diagnosis not present

## 2015-05-28 DIAGNOSIS — Z85820 Personal history of malignant melanoma of skin: Secondary | ICD-10-CM | POA: Diagnosis not present

## 2015-05-28 DIAGNOSIS — N189 Chronic kidney disease, unspecified: Secondary | ICD-10-CM | POA: Diagnosis not present

## 2015-05-28 DIAGNOSIS — Z8639 Personal history of other endocrine, nutritional and metabolic disease: Secondary | ICD-10-CM | POA: Diagnosis not present

## 2015-05-28 DIAGNOSIS — Z8719 Personal history of other diseases of the digestive system: Secondary | ICD-10-CM | POA: Diagnosis not present

## 2015-05-28 DIAGNOSIS — Z8679 Personal history of other diseases of the circulatory system: Secondary | ICD-10-CM | POA: Diagnosis not present

## 2015-05-29 DIAGNOSIS — Z85820 Personal history of malignant melanoma of skin: Secondary | ICD-10-CM | POA: Diagnosis not present

## 2015-05-29 DIAGNOSIS — Z8679 Personal history of other diseases of the circulatory system: Secondary | ICD-10-CM | POA: Diagnosis not present

## 2015-05-29 DIAGNOSIS — Z8639 Personal history of other endocrine, nutritional and metabolic disease: Secondary | ICD-10-CM | POA: Diagnosis not present

## 2015-05-29 DIAGNOSIS — Z8719 Personal history of other diseases of the digestive system: Secondary | ICD-10-CM | POA: Diagnosis not present

## 2015-05-29 DIAGNOSIS — N189 Chronic kidney disease, unspecified: Secondary | ICD-10-CM | POA: Diagnosis not present

## 2015-05-29 DIAGNOSIS — J441 Chronic obstructive pulmonary disease with (acute) exacerbation: Secondary | ICD-10-CM | POA: Diagnosis not present

## 2015-05-30 DIAGNOSIS — Z8639 Personal history of other endocrine, nutritional and metabolic disease: Secondary | ICD-10-CM | POA: Diagnosis not present

## 2015-05-30 DIAGNOSIS — Z8679 Personal history of other diseases of the circulatory system: Secondary | ICD-10-CM | POA: Diagnosis not present

## 2015-05-30 DIAGNOSIS — J441 Chronic obstructive pulmonary disease with (acute) exacerbation: Secondary | ICD-10-CM | POA: Diagnosis not present

## 2015-05-30 DIAGNOSIS — N189 Chronic kidney disease, unspecified: Secondary | ICD-10-CM | POA: Diagnosis not present

## 2015-05-30 DIAGNOSIS — Z8719 Personal history of other diseases of the digestive system: Secondary | ICD-10-CM | POA: Diagnosis not present

## 2015-05-30 DIAGNOSIS — Z85820 Personal history of malignant melanoma of skin: Secondary | ICD-10-CM | POA: Diagnosis not present

## 2015-06-02 DIAGNOSIS — Z8719 Personal history of other diseases of the digestive system: Secondary | ICD-10-CM | POA: Diagnosis not present

## 2015-06-02 DIAGNOSIS — Z8679 Personal history of other diseases of the circulatory system: Secondary | ICD-10-CM | POA: Diagnosis not present

## 2015-06-02 DIAGNOSIS — R279 Unspecified lack of coordination: Secondary | ICD-10-CM | POA: Diagnosis not present

## 2015-06-02 DIAGNOSIS — M549 Dorsalgia, unspecified: Secondary | ICD-10-CM | POA: Diagnosis not present

## 2015-06-02 DIAGNOSIS — G8929 Other chronic pain: Secondary | ICD-10-CM | POA: Diagnosis not present

## 2015-06-02 DIAGNOSIS — Z8639 Personal history of other endocrine, nutritional and metabolic disease: Secondary | ICD-10-CM | POA: Diagnosis not present

## 2015-06-02 DIAGNOSIS — Z85820 Personal history of malignant melanoma of skin: Secondary | ICD-10-CM | POA: Diagnosis not present

## 2015-06-02 DIAGNOSIS — J441 Chronic obstructive pulmonary disease with (acute) exacerbation: Secondary | ICD-10-CM | POA: Diagnosis not present

## 2015-06-02 DIAGNOSIS — N189 Chronic kidney disease, unspecified: Secondary | ICD-10-CM | POA: Diagnosis not present

## 2015-06-03 DIAGNOSIS — J441 Chronic obstructive pulmonary disease with (acute) exacerbation: Secondary | ICD-10-CM | POA: Diagnosis not present

## 2015-06-03 DIAGNOSIS — Z8639 Personal history of other endocrine, nutritional and metabolic disease: Secondary | ICD-10-CM | POA: Diagnosis not present

## 2015-06-03 DIAGNOSIS — N189 Chronic kidney disease, unspecified: Secondary | ICD-10-CM | POA: Diagnosis not present

## 2015-06-03 DIAGNOSIS — Z8719 Personal history of other diseases of the digestive system: Secondary | ICD-10-CM | POA: Diagnosis not present

## 2015-06-03 DIAGNOSIS — Z8679 Personal history of other diseases of the circulatory system: Secondary | ICD-10-CM | POA: Diagnosis not present

## 2015-06-03 DIAGNOSIS — Z85820 Personal history of malignant melanoma of skin: Secondary | ICD-10-CM | POA: Diagnosis not present

## 2015-06-04 DIAGNOSIS — Z8639 Personal history of other endocrine, nutritional and metabolic disease: Secondary | ICD-10-CM | POA: Diagnosis not present

## 2015-06-04 DIAGNOSIS — N189 Chronic kidney disease, unspecified: Secondary | ICD-10-CM | POA: Diagnosis not present

## 2015-06-04 DIAGNOSIS — R279 Unspecified lack of coordination: Secondary | ICD-10-CM | POA: Diagnosis not present

## 2015-06-04 DIAGNOSIS — J441 Chronic obstructive pulmonary disease with (acute) exacerbation: Secondary | ICD-10-CM | POA: Diagnosis not present

## 2015-06-04 DIAGNOSIS — Z8719 Personal history of other diseases of the digestive system: Secondary | ICD-10-CM | POA: Diagnosis not present

## 2015-06-04 DIAGNOSIS — Z8679 Personal history of other diseases of the circulatory system: Secondary | ICD-10-CM | POA: Diagnosis not present

## 2015-06-04 DIAGNOSIS — Z85820 Personal history of malignant melanoma of skin: Secondary | ICD-10-CM | POA: Diagnosis not present

## 2015-06-04 DIAGNOSIS — G8929 Other chronic pain: Secondary | ICD-10-CM | POA: Diagnosis not present

## 2015-06-05 DIAGNOSIS — Z8719 Personal history of other diseases of the digestive system: Secondary | ICD-10-CM | POA: Diagnosis not present

## 2015-06-05 DIAGNOSIS — Z8679 Personal history of other diseases of the circulatory system: Secondary | ICD-10-CM | POA: Diagnosis not present

## 2015-06-05 DIAGNOSIS — Z8639 Personal history of other endocrine, nutritional and metabolic disease: Secondary | ICD-10-CM | POA: Diagnosis not present

## 2015-06-05 DIAGNOSIS — N189 Chronic kidney disease, unspecified: Secondary | ICD-10-CM | POA: Diagnosis not present

## 2015-06-05 DIAGNOSIS — Z85820 Personal history of malignant melanoma of skin: Secondary | ICD-10-CM | POA: Diagnosis not present

## 2015-06-05 DIAGNOSIS — J441 Chronic obstructive pulmonary disease with (acute) exacerbation: Secondary | ICD-10-CM | POA: Diagnosis not present

## 2015-06-06 DIAGNOSIS — Z8719 Personal history of other diseases of the digestive system: Secondary | ICD-10-CM | POA: Diagnosis not present

## 2015-06-06 DIAGNOSIS — Z8639 Personal history of other endocrine, nutritional and metabolic disease: Secondary | ICD-10-CM | POA: Diagnosis not present

## 2015-06-06 DIAGNOSIS — N189 Chronic kidney disease, unspecified: Secondary | ICD-10-CM | POA: Diagnosis not present

## 2015-06-06 DIAGNOSIS — Z8679 Personal history of other diseases of the circulatory system: Secondary | ICD-10-CM | POA: Diagnosis not present

## 2015-06-06 DIAGNOSIS — J441 Chronic obstructive pulmonary disease with (acute) exacerbation: Secondary | ICD-10-CM | POA: Diagnosis not present

## 2015-06-06 DIAGNOSIS — Z85820 Personal history of malignant melanoma of skin: Secondary | ICD-10-CM | POA: Diagnosis not present

## 2015-06-07 DIAGNOSIS — J441 Chronic obstructive pulmonary disease with (acute) exacerbation: Secondary | ICD-10-CM | POA: Diagnosis not present

## 2015-06-07 DIAGNOSIS — Z8719 Personal history of other diseases of the digestive system: Secondary | ICD-10-CM | POA: Diagnosis not present

## 2015-06-07 DIAGNOSIS — Z8639 Personal history of other endocrine, nutritional and metabolic disease: Secondary | ICD-10-CM | POA: Diagnosis not present

## 2015-06-07 DIAGNOSIS — Z85820 Personal history of malignant melanoma of skin: Secondary | ICD-10-CM | POA: Diagnosis not present

## 2015-06-07 DIAGNOSIS — N189 Chronic kidney disease, unspecified: Secondary | ICD-10-CM | POA: Diagnosis not present

## 2015-06-07 DIAGNOSIS — Z8679 Personal history of other diseases of the circulatory system: Secondary | ICD-10-CM | POA: Diagnosis not present

## 2015-06-08 DIAGNOSIS — Z8639 Personal history of other endocrine, nutritional and metabolic disease: Secondary | ICD-10-CM | POA: Diagnosis not present

## 2015-06-08 DIAGNOSIS — N189 Chronic kidney disease, unspecified: Secondary | ICD-10-CM | POA: Diagnosis not present

## 2015-06-08 DIAGNOSIS — Z8719 Personal history of other diseases of the digestive system: Secondary | ICD-10-CM | POA: Diagnosis not present

## 2015-06-08 DIAGNOSIS — J441 Chronic obstructive pulmonary disease with (acute) exacerbation: Secondary | ICD-10-CM | POA: Diagnosis not present

## 2015-06-08 DIAGNOSIS — Z8679 Personal history of other diseases of the circulatory system: Secondary | ICD-10-CM | POA: Diagnosis not present

## 2015-06-08 DIAGNOSIS — Z85820 Personal history of malignant melanoma of skin: Secondary | ICD-10-CM | POA: Diagnosis not present

## 2015-06-09 ENCOUNTER — Telehealth: Payer: Self-pay | Admitting: Critical Care Medicine

## 2015-06-09 DIAGNOSIS — Z85820 Personal history of malignant melanoma of skin: Secondary | ICD-10-CM | POA: Diagnosis not present

## 2015-06-09 DIAGNOSIS — J432 Centrilobular emphysema: Secondary | ICD-10-CM

## 2015-06-09 DIAGNOSIS — N189 Chronic kidney disease, unspecified: Secondary | ICD-10-CM | POA: Diagnosis not present

## 2015-06-09 DIAGNOSIS — Z8719 Personal history of other diseases of the digestive system: Secondary | ICD-10-CM | POA: Diagnosis not present

## 2015-06-09 DIAGNOSIS — Z8679 Personal history of other diseases of the circulatory system: Secondary | ICD-10-CM | POA: Diagnosis not present

## 2015-06-09 DIAGNOSIS — J441 Chronic obstructive pulmonary disease with (acute) exacerbation: Secondary | ICD-10-CM | POA: Diagnosis not present

## 2015-06-09 DIAGNOSIS — Z8639 Personal history of other endocrine, nutritional and metabolic disease: Secondary | ICD-10-CM | POA: Diagnosis not present

## 2015-06-09 NOTE — Telephone Encounter (Signed)
Pt husband calling and states that the patient medication called in last visit was too costly - $150 Per Dr Joya Gaskins, the patient was advised to follow up with Dr Halford Chessman in 4 months (January).  Dr Halford Chessman please advise on an alternative for Anoro. Patient Instructions 04/01/15    Stay on oxygen Stay on Anoro, deep slow breath to inhale medication Return 4 months with Dr Halford Chessman  -------- Dr Halford Chessman, please advise when you would like to follow up with patient - due for f/u either at end of Dec or first week of January.  Husband states that the patient is needing Hospice care and needs physician OV notes basically stating she has COPD and would benefit from Hospice - otherwise services will be denied.

## 2015-06-09 NOTE — Telephone Encounter (Signed)
Please find out if she would prefer to use nebulizer therapy.  If so, then arrange for home nebulizer and send script for albuterol and ipratropium by nebulizer every 4 to 6 hours as needed, give 30 days supplies for each with 5 refills.

## 2015-06-10 DIAGNOSIS — M549 Dorsalgia, unspecified: Secondary | ICD-10-CM | POA: Diagnosis not present

## 2015-06-10 DIAGNOSIS — G8929 Other chronic pain: Secondary | ICD-10-CM | POA: Diagnosis not present

## 2015-06-10 DIAGNOSIS — R279 Unspecified lack of coordination: Secondary | ICD-10-CM | POA: Diagnosis not present

## 2015-06-10 MED ORDER — IPRATROPIUM-ALBUTEROL 0.5-2.5 (3) MG/3ML IN SOLN: 3.0000 mL | Freq: Four times a day (QID) | RESPIRATORY_TRACT | Status: DC | PRN

## 2015-06-10 NOTE — Telephone Encounter (Signed)
Called and spoke to pt's husband. Informed him of the recs per VS. Pt is willing to try neb meds. Order placed for neb meds and DME order. Pt aware to contact us back in a few weeks to see if the [facility] schedule is open. Pt verbalized understanding and denied any further questions or concerns at this time.

## 2015-06-12 ENCOUNTER — Telehealth: Payer: Self-pay | Admitting: Pulmonary Disease

## 2015-06-12 NOTE — Telephone Encounter (Signed)
Called and spoke [address] [facility]. Hilliard Clark stated they have not yet received the duoneb via fax, gave verbal order to [place]. Hilliard Clark verbalized understanding and denied any further questions or concerns at this time.

## 2015-06-13 DIAGNOSIS — G8929 Other chronic pain: Secondary | ICD-10-CM | POA: Diagnosis not present

## 2015-06-13 DIAGNOSIS — M549 Dorsalgia, unspecified: Secondary | ICD-10-CM | POA: Diagnosis not present

## 2015-06-24 DIAGNOSIS — J449 Chronic obstructive pulmonary disease, unspecified: Secondary | ICD-10-CM | POA: Diagnosis not present

## 2015-06-24 DIAGNOSIS — I129 Hypertensive chronic kidney disease with stage 1 through stage 4 chronic kidney disease, or unspecified chronic kidney disease: Secondary | ICD-10-CM | POA: Diagnosis not present

## 2015-06-24 DIAGNOSIS — Z87440 Personal history of urinary (tract) infections: Secondary | ICD-10-CM | POA: Diagnosis not present

## 2015-06-24 DIAGNOSIS — K219 Gastro-esophageal reflux disease without esophagitis: Secondary | ICD-10-CM | POA: Diagnosis not present

## 2015-06-24 DIAGNOSIS — F419 Anxiety disorder, unspecified: Secondary | ICD-10-CM | POA: Diagnosis not present

## 2015-06-24 DIAGNOSIS — R627 Adult failure to thrive: Secondary | ICD-10-CM | POA: Diagnosis not present

## 2015-06-24 DIAGNOSIS — N189 Chronic kidney disease, unspecified: Secondary | ICD-10-CM | POA: Diagnosis not present

## 2015-06-24 DIAGNOSIS — M810 Age-related osteoporosis without current pathological fracture: Secondary | ICD-10-CM | POA: Diagnosis not present

## 2015-06-24 DIAGNOSIS — M545 Low back pain: Secondary | ICD-10-CM | POA: Diagnosis not present

## 2015-06-24 DIAGNOSIS — E785 Hyperlipidemia, unspecified: Secondary | ICD-10-CM | POA: Diagnosis not present

## 2015-06-24 DIAGNOSIS — Z720 Tobacco use: Secondary | ICD-10-CM | POA: Diagnosis not present

## 2015-06-26 DIAGNOSIS — F419 Anxiety disorder, unspecified: Secondary | ICD-10-CM | POA: Diagnosis not present

## 2015-06-26 DIAGNOSIS — N189 Chronic kidney disease, unspecified: Secondary | ICD-10-CM | POA: Diagnosis not present

## 2015-06-26 DIAGNOSIS — K219 Gastro-esophageal reflux disease without esophagitis: Secondary | ICD-10-CM | POA: Diagnosis not present

## 2015-06-26 DIAGNOSIS — J449 Chronic obstructive pulmonary disease, unspecified: Secondary | ICD-10-CM | POA: Diagnosis not present

## 2015-06-26 DIAGNOSIS — R627 Adult failure to thrive: Secondary | ICD-10-CM | POA: Diagnosis not present

## 2015-06-26 DIAGNOSIS — I129 Hypertensive chronic kidney disease with stage 1 through stage 4 chronic kidney disease, or unspecified chronic kidney disease: Secondary | ICD-10-CM | POA: Diagnosis not present

## 2015-06-27 DIAGNOSIS — R627 Adult failure to thrive: Secondary | ICD-10-CM | POA: Diagnosis not present

## 2015-06-27 DIAGNOSIS — F419 Anxiety disorder, unspecified: Secondary | ICD-10-CM | POA: Diagnosis not present

## 2015-06-27 DIAGNOSIS — K219 Gastro-esophageal reflux disease without esophagitis: Secondary | ICD-10-CM | POA: Diagnosis not present

## 2015-06-27 DIAGNOSIS — N189 Chronic kidney disease, unspecified: Secondary | ICD-10-CM | POA: Diagnosis not present

## 2015-06-27 DIAGNOSIS — J449 Chronic obstructive pulmonary disease, unspecified: Secondary | ICD-10-CM | POA: Diagnosis not present

## 2015-06-27 DIAGNOSIS — I129 Hypertensive chronic kidney disease with stage 1 through stage 4 chronic kidney disease, or unspecified chronic kidney disease: Secondary | ICD-10-CM | POA: Diagnosis not present

## 2015-06-30 DIAGNOSIS — J449 Chronic obstructive pulmonary disease, unspecified: Secondary | ICD-10-CM | POA: Diagnosis not present

## 2015-06-30 DIAGNOSIS — I129 Hypertensive chronic kidney disease with stage 1 through stage 4 chronic kidney disease, or unspecified chronic kidney disease: Secondary | ICD-10-CM | POA: Diagnosis not present

## 2015-06-30 DIAGNOSIS — F419 Anxiety disorder, unspecified: Secondary | ICD-10-CM | POA: Diagnosis not present

## 2015-06-30 DIAGNOSIS — K219 Gastro-esophageal reflux disease without esophagitis: Secondary | ICD-10-CM | POA: Diagnosis not present

## 2015-06-30 DIAGNOSIS — R627 Adult failure to thrive: Secondary | ICD-10-CM | POA: Diagnosis not present

## 2015-06-30 DIAGNOSIS — N189 Chronic kidney disease, unspecified: Secondary | ICD-10-CM | POA: Diagnosis not present

## 2015-07-01 ENCOUNTER — Telehealth: Payer: Self-pay | Admitting: Pulmonary Disease

## 2015-07-01 DIAGNOSIS — J432 Centrilobular emphysema: Secondary | ICD-10-CM

## 2015-07-01 DIAGNOSIS — F419 Anxiety disorder, unspecified: Secondary | ICD-10-CM | POA: Diagnosis not present

## 2015-07-01 DIAGNOSIS — K219 Gastro-esophageal reflux disease without esophagitis: Secondary | ICD-10-CM | POA: Diagnosis not present

## 2015-07-01 DIAGNOSIS — N189 Chronic kidney disease, unspecified: Secondary | ICD-10-CM | POA: Diagnosis not present

## 2015-07-01 DIAGNOSIS — R627 Adult failure to thrive: Secondary | ICD-10-CM | POA: Diagnosis not present

## 2015-07-01 DIAGNOSIS — J449 Chronic obstructive pulmonary disease, unspecified: Secondary | ICD-10-CM | POA: Diagnosis not present

## 2015-07-01 DIAGNOSIS — I129 Hypertensive chronic kidney disease with stage 1 through stage 4 chronic kidney disease, or unspecified chronic kidney disease: Secondary | ICD-10-CM | POA: Diagnosis not present

## 2015-07-01 NOTE — Telephone Encounter (Signed)
error 

## 2015-07-01 NOTE — Telephone Encounter (Signed)
Spoke with Cheyenne [name] at [facility], states that pt needs an order for a neb.  Pt gets neb meds through [place] but pt does not have a nebulizer.    VS are you ok with this order?  Thanks!

## 2015-07-01 NOTE — Telephone Encounter (Signed)
Please send order for home nebulizer set up.

## 2015-07-01 NOTE — Telephone Encounter (Signed)
Order placed for nebulizer.  Nothing further needed.

## 2015-07-02 DIAGNOSIS — N189 Chronic kidney disease, unspecified: Secondary | ICD-10-CM | POA: Diagnosis not present

## 2015-07-02 DIAGNOSIS — J449 Chronic obstructive pulmonary disease, unspecified: Secondary | ICD-10-CM | POA: Diagnosis not present

## 2015-07-02 DIAGNOSIS — F419 Anxiety disorder, unspecified: Secondary | ICD-10-CM | POA: Diagnosis not present

## 2015-07-02 DIAGNOSIS — K219 Gastro-esophageal reflux disease without esophagitis: Secondary | ICD-10-CM | POA: Diagnosis not present

## 2015-07-02 DIAGNOSIS — I129 Hypertensive chronic kidney disease with stage 1 through stage 4 chronic kidney disease, or unspecified chronic kidney disease: Secondary | ICD-10-CM | POA: Diagnosis not present

## 2015-07-02 DIAGNOSIS — R627 Adult failure to thrive: Secondary | ICD-10-CM | POA: Diagnosis not present

## 2015-07-03 DIAGNOSIS — K219 Gastro-esophageal reflux disease without esophagitis: Secondary | ICD-10-CM | POA: Diagnosis not present

## 2015-07-03 DIAGNOSIS — F419 Anxiety disorder, unspecified: Secondary | ICD-10-CM | POA: Diagnosis not present

## 2015-07-03 DIAGNOSIS — R627 Adult failure to thrive: Secondary | ICD-10-CM | POA: Diagnosis not present

## 2015-07-03 DIAGNOSIS — N189 Chronic kidney disease, unspecified: Secondary | ICD-10-CM | POA: Diagnosis not present

## 2015-07-03 DIAGNOSIS — I129 Hypertensive chronic kidney disease with stage 1 through stage 4 chronic kidney disease, or unspecified chronic kidney disease: Secondary | ICD-10-CM | POA: Diagnosis not present

## 2015-07-03 DIAGNOSIS — J449 Chronic obstructive pulmonary disease, unspecified: Secondary | ICD-10-CM | POA: Diagnosis not present

## 2015-07-07 DIAGNOSIS — Z96641 Presence of right artificial hip joint: Secondary | ICD-10-CM | POA: Diagnosis not present

## 2015-07-07 DIAGNOSIS — R278 Other lack of coordination: Secondary | ICD-10-CM | POA: Diagnosis not present

## 2015-07-07 DIAGNOSIS — R05 Cough: Secondary | ICD-10-CM | POA: Diagnosis not present

## 2015-07-07 DIAGNOSIS — Z6828 Body mass index (BMI) 28.0-28.9, adult: Secondary | ICD-10-CM | POA: Diagnosis not present

## 2015-07-07 DIAGNOSIS — F329 Major depressive disorder, single episode, unspecified: Secondary | ICD-10-CM | POA: Diagnosis present

## 2015-07-07 DIAGNOSIS — R739 Hyperglycemia, unspecified: Secondary | ICD-10-CM | POA: Diagnosis present

## 2015-07-07 DIAGNOSIS — K219 Gastro-esophageal reflux disease without esophagitis: Secondary | ICD-10-CM | POA: Diagnosis not present

## 2015-07-07 DIAGNOSIS — M1990 Unspecified osteoarthritis, unspecified site: Secondary | ICD-10-CM | POA: Diagnosis present

## 2015-07-07 DIAGNOSIS — R6510 Systemic inflammatory response syndrome (SIRS) of non-infectious origin without acute organ dysfunction: Secondary | ICD-10-CM | POA: Insufficient documentation

## 2015-07-07 DIAGNOSIS — J449 Chronic obstructive pulmonary disease, unspecified: Secondary | ICD-10-CM | POA: Diagnosis not present

## 2015-07-07 DIAGNOSIS — F339 Major depressive disorder, recurrent, unspecified: Secondary | ICD-10-CM | POA: Diagnosis not present

## 2015-07-07 DIAGNOSIS — Z66 Do not resuscitate: Secondary | ICD-10-CM | POA: Diagnosis not present

## 2015-07-07 DIAGNOSIS — Z87891 Personal history of nicotine dependence: Secondary | ICD-10-CM | POA: Diagnosis not present

## 2015-07-07 DIAGNOSIS — I82432 Acute embolism and thrombosis of left popliteal vein: Secondary | ICD-10-CM | POA: Diagnosis not present

## 2015-07-07 DIAGNOSIS — E038 Other specified hypothyroidism: Secondary | ICD-10-CM | POA: Diagnosis not present

## 2015-07-07 DIAGNOSIS — I824Y9 Acute embolism and thrombosis of unspecified deep veins of unspecified proximal lower extremity: Secondary | ICD-10-CM | POA: Diagnosis not present

## 2015-07-07 DIAGNOSIS — R262 Difficulty in walking, not elsewhere classified: Secondary | ICD-10-CM | POA: Diagnosis not present

## 2015-07-07 DIAGNOSIS — I10 Essential (primary) hypertension: Secondary | ICD-10-CM | POA: Diagnosis present

## 2015-07-07 DIAGNOSIS — D696 Thrombocytopenia, unspecified: Secondary | ICD-10-CM | POA: Diagnosis present

## 2015-07-07 DIAGNOSIS — Z96642 Presence of left artificial hip joint: Secondary | ICD-10-CM | POA: Diagnosis not present

## 2015-07-07 DIAGNOSIS — E559 Vitamin D deficiency, unspecified: Secondary | ICD-10-CM | POA: Diagnosis not present

## 2015-07-07 DIAGNOSIS — J439 Emphysema, unspecified: Secondary | ICD-10-CM | POA: Diagnosis present

## 2015-07-07 DIAGNOSIS — G894 Chronic pain syndrome: Secondary | ICD-10-CM | POA: Diagnosis not present

## 2015-07-07 DIAGNOSIS — R52 Pain, unspecified: Secondary | ICD-10-CM | POA: Diagnosis not present

## 2015-07-07 DIAGNOSIS — Z7901 Long term (current) use of anticoagulants: Secondary | ICD-10-CM | POA: Diagnosis not present

## 2015-07-07 DIAGNOSIS — I2699 Other pulmonary embolism without acute cor pulmonale: Secondary | ICD-10-CM | POA: Diagnosis not present

## 2015-07-07 DIAGNOSIS — E039 Hypothyroidism, unspecified: Secondary | ICD-10-CM | POA: Diagnosis not present

## 2015-07-07 DIAGNOSIS — Z880 Allergy status to penicillin: Secondary | ICD-10-CM | POA: Diagnosis not present

## 2015-07-07 DIAGNOSIS — R569 Unspecified convulsions: Secondary | ICD-10-CM | POA: Diagnosis not present

## 2015-07-07 DIAGNOSIS — I082 Rheumatic disorders of both aortic and tricuspid valves: Secondary | ICD-10-CM | POA: Diagnosis not present

## 2015-07-07 DIAGNOSIS — Z6826 Body mass index (BMI) 26.0-26.9, adult: Secondary | ICD-10-CM | POA: Diagnosis not present

## 2015-07-07 DIAGNOSIS — E872 Acidosis: Secondary | ICD-10-CM | POA: Diagnosis not present

## 2015-07-07 DIAGNOSIS — I129 Hypertensive chronic kidney disease with stage 1 through stage 4 chronic kidney disease, or unspecified chronic kidney disease: Secondary | ICD-10-CM | POA: Diagnosis not present

## 2015-07-07 DIAGNOSIS — E876 Hypokalemia: Secondary | ICD-10-CM | POA: Diagnosis not present

## 2015-07-07 DIAGNOSIS — R918 Other nonspecific abnormal finding of lung field: Secondary | ICD-10-CM | POA: Diagnosis not present

## 2015-07-07 DIAGNOSIS — Z515 Encounter for palliative care: Secondary | ICD-10-CM | POA: Diagnosis not present

## 2015-07-07 DIAGNOSIS — M6281 Muscle weakness (generalized): Secondary | ICD-10-CM | POA: Diagnosis not present

## 2015-07-07 DIAGNOSIS — I2510 Atherosclerotic heart disease of native coronary artery without angina pectoris: Secondary | ICD-10-CM | POA: Diagnosis present

## 2015-07-07 DIAGNOSIS — D519 Vitamin B12 deficiency anemia, unspecified: Secondary | ICD-10-CM | POA: Diagnosis not present

## 2015-07-07 DIAGNOSIS — I82412 Acute embolism and thrombosis of left femoral vein: Secondary | ICD-10-CM | POA: Diagnosis present

## 2015-07-07 DIAGNOSIS — I517 Cardiomegaly: Secondary | ICD-10-CM | POA: Diagnosis not present

## 2015-07-07 DIAGNOSIS — R911 Solitary pulmonary nodule: Secondary | ICD-10-CM | POA: Diagnosis present

## 2015-07-07 DIAGNOSIS — I950 Idiopathic hypotension: Secondary | ICD-10-CM | POA: Diagnosis not present

## 2015-07-07 DIAGNOSIS — D649 Anemia, unspecified: Secondary | ICD-10-CM | POA: Diagnosis not present

## 2015-07-07 DIAGNOSIS — L89320 Pressure ulcer of left buttock, unstageable: Secondary | ICD-10-CM | POA: Diagnosis not present

## 2015-07-07 DIAGNOSIS — I824Y2 Acute embolism and thrombosis of unspecified deep veins of left proximal lower extremity: Secondary | ICD-10-CM | POA: Diagnosis not present

## 2015-07-07 DIAGNOSIS — F419 Anxiety disorder, unspecified: Secondary | ICD-10-CM | POA: Diagnosis not present

## 2015-07-07 DIAGNOSIS — R627 Adult failure to thrive: Secondary | ICD-10-CM | POA: Diagnosis not present

## 2015-07-07 DIAGNOSIS — K5900 Constipation, unspecified: Secondary | ICD-10-CM | POA: Diagnosis not present

## 2015-07-07 DIAGNOSIS — Z780 Asymptomatic menopausal state: Secondary | ICD-10-CM | POA: Diagnosis not present

## 2015-07-07 DIAGNOSIS — L89322 Pressure ulcer of left buttock, stage 2: Secondary | ICD-10-CM | POA: Diagnosis not present

## 2015-07-07 DIAGNOSIS — M4726 Other spondylosis with radiculopathy, lumbar region: Secondary | ICD-10-CM | POA: Diagnosis not present

## 2015-07-07 DIAGNOSIS — N189 Chronic kidney disease, unspecified: Secondary | ICD-10-CM | POA: Diagnosis not present

## 2015-07-07 DIAGNOSIS — E860 Dehydration: Secondary | ICD-10-CM | POA: Diagnosis present

## 2015-07-07 HISTORY — DX: Systemic inflammatory response syndrome (sirs) of non-infectious origin without acute organ dysfunction: R65.10

## 2015-07-08 DIAGNOSIS — I82412 Acute embolism and thrombosis of left femoral vein: Secondary | ICD-10-CM

## 2015-07-08 DIAGNOSIS — I2699 Other pulmonary embolism without acute cor pulmonale: Secondary | ICD-10-CM | POA: Insufficient documentation

## 2015-07-08 HISTORY — DX: Acute embolism and thrombosis of left femoral vein: I82.412

## 2015-07-08 HISTORY — DX: Other pulmonary embolism without acute cor pulmonale: I26.99

## 2015-07-14 DIAGNOSIS — R52 Pain, unspecified: Secondary | ICD-10-CM | POA: Diagnosis not present

## 2015-07-14 DIAGNOSIS — N390 Urinary tract infection, site not specified: Secondary | ICD-10-CM | POA: Diagnosis present

## 2015-07-14 DIAGNOSIS — N189 Chronic kidney disease, unspecified: Secondary | ICD-10-CM | POA: Diagnosis not present

## 2015-07-14 DIAGNOSIS — Z87891 Personal history of nicotine dependence: Secondary | ICD-10-CM | POA: Diagnosis not present

## 2015-07-14 DIAGNOSIS — Z96641 Presence of right artificial hip joint: Secondary | ICD-10-CM | POA: Diagnosis not present

## 2015-07-14 DIAGNOSIS — R918 Other nonspecific abnormal finding of lung field: Secondary | ICD-10-CM | POA: Diagnosis not present

## 2015-07-14 DIAGNOSIS — R569 Unspecified convulsions: Secondary | ICD-10-CM | POA: Diagnosis not present

## 2015-07-14 DIAGNOSIS — Z7952 Long term (current) use of systemic steroids: Secondary | ICD-10-CM | POA: Diagnosis not present

## 2015-07-14 DIAGNOSIS — M6281 Muscle weakness (generalized): Secondary | ICD-10-CM | POA: Diagnosis not present

## 2015-07-14 DIAGNOSIS — R109 Unspecified abdominal pain: Secondary | ICD-10-CM | POA: Diagnosis not present

## 2015-07-14 DIAGNOSIS — Z79899 Other long term (current) drug therapy: Secondary | ICD-10-CM | POA: Diagnosis not present

## 2015-07-14 DIAGNOSIS — N289 Disorder of kidney and ureter, unspecified: Secondary | ICD-10-CM | POA: Diagnosis not present

## 2015-07-14 DIAGNOSIS — I129 Hypertensive chronic kidney disease with stage 1 through stage 4 chronic kidney disease, or unspecified chronic kidney disease: Secondary | ICD-10-CM | POA: Diagnosis not present

## 2015-07-14 DIAGNOSIS — D649 Anemia, unspecified: Secondary | ICD-10-CM | POA: Diagnosis not present

## 2015-07-14 DIAGNOSIS — Z86711 Personal history of pulmonary embolism: Secondary | ICD-10-CM | POA: Diagnosis not present

## 2015-07-14 DIAGNOSIS — I700 Atherosclerosis of aorta: Secondary | ICD-10-CM | POA: Diagnosis not present

## 2015-07-14 DIAGNOSIS — K921 Melena: Secondary | ICD-10-CM | POA: Diagnosis not present

## 2015-07-14 DIAGNOSIS — Z7901 Long term (current) use of anticoagulants: Secondary | ICD-10-CM | POA: Diagnosis not present

## 2015-07-14 DIAGNOSIS — D62 Acute posthemorrhagic anemia: Secondary | ICD-10-CM | POA: Diagnosis not present

## 2015-07-14 DIAGNOSIS — Z86718 Personal history of other venous thrombosis and embolism: Secondary | ICD-10-CM | POA: Diagnosis not present

## 2015-07-14 DIAGNOSIS — Z6828 Body mass index (BMI) 28.0-28.9, adult: Secondary | ICD-10-CM | POA: Diagnosis not present

## 2015-07-14 DIAGNOSIS — Z96642 Presence of left artificial hip joint: Secondary | ICD-10-CM | POA: Diagnosis not present

## 2015-07-14 DIAGNOSIS — G40909 Epilepsy, unspecified, not intractable, without status epilepticus: Secondary | ICD-10-CM | POA: Diagnosis present

## 2015-07-14 DIAGNOSIS — I2699 Other pulmonary embolism without acute cor pulmonale: Secondary | ICD-10-CM | POA: Diagnosis not present

## 2015-07-14 DIAGNOSIS — C3411 Malignant neoplasm of upper lobe, right bronchus or lung: Secondary | ICD-10-CM | POA: Diagnosis present

## 2015-07-14 DIAGNOSIS — K259 Gastric ulcer, unspecified as acute or chronic, without hemorrhage or perforation: Secondary | ICD-10-CM | POA: Diagnosis present

## 2015-07-14 DIAGNOSIS — G894 Chronic pain syndrome: Secondary | ICD-10-CM | POA: Diagnosis not present

## 2015-07-14 DIAGNOSIS — R911 Solitary pulmonary nodule: Secondary | ICD-10-CM | POA: Diagnosis not present

## 2015-07-14 DIAGNOSIS — Z880 Allergy status to penicillin: Secondary | ICD-10-CM | POA: Diagnosis not present

## 2015-07-14 DIAGNOSIS — M4726 Other spondylosis with radiculopathy, lumbar region: Secondary | ICD-10-CM | POA: Diagnosis not present

## 2015-07-14 DIAGNOSIS — E038 Other specified hypothyroidism: Secondary | ICD-10-CM | POA: Diagnosis not present

## 2015-07-14 DIAGNOSIS — D519 Vitamin B12 deficiency anemia, unspecified: Secondary | ICD-10-CM | POA: Diagnosis not present

## 2015-07-14 DIAGNOSIS — J432 Centrilobular emphysema: Secondary | ICD-10-CM | POA: Diagnosis not present

## 2015-07-14 DIAGNOSIS — Z7982 Long term (current) use of aspirin: Secondary | ICD-10-CM | POA: Diagnosis not present

## 2015-07-14 DIAGNOSIS — M1990 Unspecified osteoarthritis, unspecified site: Secondary | ICD-10-CM | POA: Diagnosis present

## 2015-07-14 DIAGNOSIS — Z91030 Bee allergy status: Secondary | ICD-10-CM | POA: Diagnosis not present

## 2015-07-14 DIAGNOSIS — K5900 Constipation, unspecified: Secondary | ICD-10-CM | POA: Diagnosis present

## 2015-07-14 DIAGNOSIS — M549 Dorsalgia, unspecified: Secondary | ICD-10-CM | POA: Diagnosis present

## 2015-07-14 DIAGNOSIS — R6510 Systemic inflammatory response syndrome (SIRS) of non-infectious origin without acute organ dysfunction: Secondary | ICD-10-CM | POA: Diagnosis not present

## 2015-07-14 DIAGNOSIS — E039 Hypothyroidism, unspecified: Secondary | ICD-10-CM | POA: Diagnosis not present

## 2015-07-14 DIAGNOSIS — I10 Essential (primary) hypertension: Secondary | ICD-10-CM | POA: Diagnosis not present

## 2015-07-14 DIAGNOSIS — I824Y9 Acute embolism and thrombosis of unspecified deep veins of unspecified proximal lower extremity: Secondary | ICD-10-CM | POA: Diagnosis not present

## 2015-07-14 DIAGNOSIS — R262 Difficulty in walking, not elsewhere classified: Secondary | ICD-10-CM | POA: Diagnosis not present

## 2015-07-14 DIAGNOSIS — R079 Chest pain, unspecified: Secondary | ICD-10-CM | POA: Diagnosis not present

## 2015-07-14 DIAGNOSIS — I82412 Acute embolism and thrombosis of left femoral vein: Secondary | ICD-10-CM | POA: Diagnosis not present

## 2015-07-14 DIAGNOSIS — C3491 Malignant neoplasm of unspecified part of right bronchus or lung: Secondary | ICD-10-CM | POA: Diagnosis not present

## 2015-07-14 DIAGNOSIS — L89320 Pressure ulcer of left buttock, unstageable: Secondary | ICD-10-CM | POA: Diagnosis not present

## 2015-07-14 DIAGNOSIS — R627 Adult failure to thrive: Secondary | ICD-10-CM | POA: Diagnosis not present

## 2015-07-14 DIAGNOSIS — R2681 Unsteadiness on feet: Secondary | ICD-10-CM | POA: Diagnosis not present

## 2015-07-14 DIAGNOSIS — K219 Gastro-esophageal reflux disease without esophagitis: Secondary | ICD-10-CM | POA: Diagnosis not present

## 2015-07-14 DIAGNOSIS — I824Y2 Acute embolism and thrombosis of unspecified deep veins of left proximal lower extremity: Secondary | ICD-10-CM | POA: Diagnosis not present

## 2015-07-14 DIAGNOSIS — K319 Disease of stomach and duodenum, unspecified: Secondary | ICD-10-CM | POA: Diagnosis not present

## 2015-07-14 DIAGNOSIS — G8929 Other chronic pain: Secondary | ICD-10-CM | POA: Diagnosis present

## 2015-07-14 DIAGNOSIS — J449 Chronic obstructive pulmonary disease, unspecified: Secondary | ICD-10-CM | POA: Diagnosis not present

## 2015-07-14 DIAGNOSIS — R278 Other lack of coordination: Secondary | ICD-10-CM | POA: Diagnosis not present

## 2015-07-14 DIAGNOSIS — Z66 Do not resuscitate: Secondary | ICD-10-CM | POA: Insufficient documentation

## 2015-07-14 DIAGNOSIS — F339 Major depressive disorder, recurrent, unspecified: Secondary | ICD-10-CM | POA: Diagnosis not present

## 2015-07-14 DIAGNOSIS — E559 Vitamin D deficiency, unspecified: Secondary | ICD-10-CM | POA: Diagnosis not present

## 2015-07-14 DIAGNOSIS — K254 Chronic or unspecified gastric ulcer with hemorrhage: Secondary | ICD-10-CM | POA: Diagnosis not present

## 2015-07-14 DIAGNOSIS — J439 Emphysema, unspecified: Secondary | ICD-10-CM | POA: Diagnosis present

## 2015-07-14 HISTORY — DX: Do not resuscitate: Z66

## 2015-07-15 DIAGNOSIS — G894 Chronic pain syndrome: Secondary | ICD-10-CM | POA: Diagnosis not present

## 2015-07-15 DIAGNOSIS — M6281 Muscle weakness (generalized): Secondary | ICD-10-CM | POA: Diagnosis not present

## 2015-07-15 DIAGNOSIS — R2681 Unsteadiness on feet: Secondary | ICD-10-CM | POA: Diagnosis not present

## 2015-07-23 DIAGNOSIS — J449 Chronic obstructive pulmonary disease, unspecified: Secondary | ICD-10-CM | POA: Diagnosis not present

## 2015-07-23 DIAGNOSIS — N189 Chronic kidney disease, unspecified: Secondary | ICD-10-CM | POA: Diagnosis not present

## 2015-07-23 DIAGNOSIS — I129 Hypertensive chronic kidney disease with stage 1 through stage 4 chronic kidney disease, or unspecified chronic kidney disease: Secondary | ICD-10-CM | POA: Diagnosis not present

## 2015-07-25 DIAGNOSIS — M6281 Muscle weakness (generalized): Secondary | ICD-10-CM | POA: Diagnosis not present

## 2015-07-25 DIAGNOSIS — G894 Chronic pain syndrome: Secondary | ICD-10-CM | POA: Diagnosis not present

## 2015-07-25 DIAGNOSIS — R262 Difficulty in walking, not elsewhere classified: Secondary | ICD-10-CM | POA: Diagnosis not present

## 2015-08-07 DIAGNOSIS — R52 Pain, unspecified: Secondary | ICD-10-CM | POA: Diagnosis not present

## 2015-08-07 DIAGNOSIS — R918 Other nonspecific abnormal finding of lung field: Secondary | ICD-10-CM | POA: Diagnosis not present

## 2015-08-07 DIAGNOSIS — E039 Hypothyroidism, unspecified: Secondary | ICD-10-CM | POA: Diagnosis not present

## 2015-08-08 DIAGNOSIS — I700 Atherosclerosis of aorta: Secondary | ICD-10-CM | POA: Diagnosis not present

## 2015-08-08 DIAGNOSIS — J439 Emphysema, unspecified: Secondary | ICD-10-CM | POA: Diagnosis not present

## 2015-08-08 DIAGNOSIS — C3491 Malignant neoplasm of unspecified part of right bronchus or lung: Secondary | ICD-10-CM | POA: Diagnosis not present

## 2015-08-08 DIAGNOSIS — N289 Disorder of kidney and ureter, unspecified: Secondary | ICD-10-CM | POA: Diagnosis not present

## 2015-08-08 DIAGNOSIS — C3411 Malignant neoplasm of upper lobe, right bronchus or lung: Secondary | ICD-10-CM | POA: Diagnosis not present

## 2015-08-12 DIAGNOSIS — J432 Centrilobular emphysema: Secondary | ICD-10-CM | POA: Diagnosis not present

## 2015-08-12 DIAGNOSIS — I2699 Other pulmonary embolism without acute cor pulmonale: Secondary | ICD-10-CM | POA: Diagnosis not present

## 2015-08-12 DIAGNOSIS — R6251 Failure to thrive (child): Secondary | ICD-10-CM

## 2015-08-12 DIAGNOSIS — F329 Major depressive disorder, single episode, unspecified: Secondary | ICD-10-CM

## 2015-08-12 DIAGNOSIS — D649 Anemia, unspecified: Secondary | ICD-10-CM | POA: Insufficient documentation

## 2015-08-12 DIAGNOSIS — I824Y2 Acute embolism and thrombosis of unspecified deep veins of left proximal lower extremity: Secondary | ICD-10-CM

## 2015-08-12 DIAGNOSIS — R911 Solitary pulmonary nodule: Secondary | ICD-10-CM | POA: Diagnosis not present

## 2015-08-12 HISTORY — DX: Acute embolism and thrombosis of unspecified deep veins of left proximal lower extremity: I82.4Y2

## 2015-08-12 HISTORY — DX: Failure to thrive (child): R62.51

## 2015-08-12 HISTORY — DX: Anemia, unspecified: D64.9

## 2015-08-12 HISTORY — DX: Major depressive disorder, single episode, unspecified: F32.9

## 2015-08-18 DIAGNOSIS — R918 Other nonspecific abnormal finding of lung field: Secondary | ICD-10-CM | POA: Diagnosis not present

## 2015-08-18 DIAGNOSIS — R911 Solitary pulmonary nodule: Secondary | ICD-10-CM | POA: Diagnosis not present

## 2015-08-18 DIAGNOSIS — Z79899 Other long term (current) drug therapy: Secondary | ICD-10-CM | POA: Diagnosis not present

## 2015-08-25 DIAGNOSIS — N390 Urinary tract infection, site not specified: Secondary | ICD-10-CM | POA: Diagnosis not present

## 2015-08-25 DIAGNOSIS — Z91030 Bee allergy status: Secondary | ICD-10-CM | POA: Diagnosis not present

## 2015-08-25 DIAGNOSIS — R918 Other nonspecific abnormal finding of lung field: Secondary | ICD-10-CM | POA: Diagnosis not present

## 2015-08-25 DIAGNOSIS — Z87891 Personal history of nicotine dependence: Secondary | ICD-10-CM | POA: Diagnosis not present

## 2015-08-25 DIAGNOSIS — E559 Vitamin D deficiency, unspecified: Secondary | ICD-10-CM | POA: Diagnosis not present

## 2015-08-25 DIAGNOSIS — Z86711 Personal history of pulmonary embolism: Secondary | ICD-10-CM | POA: Diagnosis not present

## 2015-08-25 DIAGNOSIS — Z741 Need for assistance with personal care: Secondary | ICD-10-CM | POA: Diagnosis not present

## 2015-08-25 DIAGNOSIS — D519 Vitamin B12 deficiency anemia, unspecified: Secondary | ICD-10-CM | POA: Diagnosis not present

## 2015-08-25 DIAGNOSIS — Z7952 Long term (current) use of systemic steroids: Secondary | ICD-10-CM | POA: Diagnosis not present

## 2015-08-25 DIAGNOSIS — R262 Difficulty in walking, not elsewhere classified: Secondary | ICD-10-CM | POA: Diagnosis not present

## 2015-08-25 DIAGNOSIS — F339 Major depressive disorder, recurrent, unspecified: Secondary | ICD-10-CM | POA: Diagnosis not present

## 2015-08-25 DIAGNOSIS — K5900 Constipation, unspecified: Secondary | ICD-10-CM | POA: Diagnosis present

## 2015-08-25 DIAGNOSIS — Z86718 Personal history of other venous thrombosis and embolism: Secondary | ICD-10-CM | POA: Diagnosis not present

## 2015-08-25 DIAGNOSIS — K319 Disease of stomach and duodenum, unspecified: Secondary | ICD-10-CM | POA: Diagnosis not present

## 2015-08-25 DIAGNOSIS — D62 Acute posthemorrhagic anemia: Secondary | ICD-10-CM | POA: Diagnosis present

## 2015-08-25 DIAGNOSIS — G40909 Epilepsy, unspecified, not intractable, without status epilepticus: Secondary | ICD-10-CM | POA: Diagnosis present

## 2015-08-25 DIAGNOSIS — K219 Gastro-esophageal reflux disease without esophagitis: Secondary | ICD-10-CM | POA: Diagnosis not present

## 2015-08-25 DIAGNOSIS — K259 Gastric ulcer, unspecified as acute or chronic, without hemorrhage or perforation: Secondary | ICD-10-CM | POA: Diagnosis not present

## 2015-08-25 DIAGNOSIS — G8929 Other chronic pain: Secondary | ICD-10-CM | POA: Diagnosis present

## 2015-08-25 DIAGNOSIS — R569 Unspecified convulsions: Secondary | ICD-10-CM | POA: Diagnosis not present

## 2015-08-25 DIAGNOSIS — Z66 Do not resuscitate: Secondary | ICD-10-CM | POA: Diagnosis present

## 2015-08-25 DIAGNOSIS — M6281 Muscle weakness (generalized): Secondary | ICD-10-CM | POA: Diagnosis not present

## 2015-08-25 DIAGNOSIS — M1990 Unspecified osteoarthritis, unspecified site: Secondary | ICD-10-CM | POA: Diagnosis present

## 2015-08-25 DIAGNOSIS — R627 Adult failure to thrive: Secondary | ICD-10-CM | POA: Diagnosis not present

## 2015-08-25 DIAGNOSIS — J449 Chronic obstructive pulmonary disease, unspecified: Secondary | ICD-10-CM | POA: Diagnosis not present

## 2015-08-25 DIAGNOSIS — Z6827 Body mass index (BMI) 27.0-27.9, adult: Secondary | ICD-10-CM | POA: Diagnosis not present

## 2015-08-25 DIAGNOSIS — Z96642 Presence of left artificial hip joint: Secondary | ICD-10-CM | POA: Diagnosis not present

## 2015-08-25 DIAGNOSIS — I10 Essential (primary) hypertension: Secondary | ICD-10-CM | POA: Diagnosis not present

## 2015-08-25 DIAGNOSIS — Z79899 Other long term (current) drug therapy: Secondary | ICD-10-CM | POA: Diagnosis not present

## 2015-08-25 DIAGNOSIS — R279 Unspecified lack of coordination: Secondary | ICD-10-CM | POA: Diagnosis not present

## 2015-08-25 DIAGNOSIS — Z7982 Long term (current) use of aspirin: Secondary | ICD-10-CM | POA: Diagnosis not present

## 2015-08-25 DIAGNOSIS — D649 Anemia, unspecified: Secondary | ICD-10-CM | POA: Diagnosis not present

## 2015-08-25 DIAGNOSIS — L89322 Pressure ulcer of left buttock, stage 2: Secondary | ICD-10-CM | POA: Diagnosis not present

## 2015-08-25 DIAGNOSIS — E038 Other specified hypothyroidism: Secondary | ICD-10-CM | POA: Diagnosis not present

## 2015-08-25 DIAGNOSIS — E039 Hypothyroidism, unspecified: Secondary | ICD-10-CM | POA: Diagnosis present

## 2015-08-25 DIAGNOSIS — R109 Unspecified abdominal pain: Secondary | ICD-10-CM | POA: Diagnosis not present

## 2015-08-25 DIAGNOSIS — R6510 Systemic inflammatory response syndrome (SIRS) of non-infectious origin without acute organ dysfunction: Secondary | ICD-10-CM | POA: Diagnosis not present

## 2015-08-25 DIAGNOSIS — I824Y2 Acute embolism and thrombosis of unspecified deep veins of left proximal lower extremity: Secondary | ICD-10-CM | POA: Diagnosis not present

## 2015-08-25 DIAGNOSIS — M4726 Other spondylosis with radiculopathy, lumbar region: Secondary | ICD-10-CM | POA: Diagnosis not present

## 2015-08-25 DIAGNOSIS — K921 Melena: Secondary | ICD-10-CM | POA: Diagnosis not present

## 2015-08-25 DIAGNOSIS — R911 Solitary pulmonary nodule: Secondary | ICD-10-CM | POA: Diagnosis not present

## 2015-08-25 DIAGNOSIS — J439 Emphysema, unspecified: Secondary | ICD-10-CM | POA: Diagnosis present

## 2015-08-25 DIAGNOSIS — G894 Chronic pain syndrome: Secondary | ICD-10-CM | POA: Diagnosis not present

## 2015-08-25 DIAGNOSIS — K254 Chronic or unspecified gastric ulcer with hemorrhage: Secondary | ICD-10-CM | POA: Diagnosis not present

## 2015-08-25 DIAGNOSIS — Z7901 Long term (current) use of anticoagulants: Secondary | ICD-10-CM | POA: Diagnosis not present

## 2015-08-25 DIAGNOSIS — C3411 Malignant neoplasm of upper lobe, right bronchus or lung: Secondary | ICD-10-CM | POA: Diagnosis not present

## 2015-08-25 DIAGNOSIS — I2699 Other pulmonary embolism without acute cor pulmonale: Secondary | ICD-10-CM | POA: Diagnosis not present

## 2015-08-25 DIAGNOSIS — M549 Dorsalgia, unspecified: Secondary | ICD-10-CM | POA: Diagnosis present

## 2015-08-25 DIAGNOSIS — Z880 Allergy status to penicillin: Secondary | ICD-10-CM | POA: Diagnosis not present

## 2015-08-26 DIAGNOSIS — C3411 Malignant neoplasm of upper lobe, right bronchus or lung: Secondary | ICD-10-CM | POA: Diagnosis not present

## 2015-08-28 DIAGNOSIS — J449 Chronic obstructive pulmonary disease, unspecified: Secondary | ICD-10-CM | POA: Diagnosis not present

## 2015-08-28 DIAGNOSIS — F332 Major depressive disorder, recurrent severe without psychotic features: Secondary | ICD-10-CM | POA: Diagnosis not present

## 2015-08-28 DIAGNOSIS — I2699 Other pulmonary embolism without acute cor pulmonale: Secondary | ICD-10-CM | POA: Diagnosis not present

## 2015-08-28 DIAGNOSIS — Z741 Need for assistance with personal care: Secondary | ICD-10-CM | POA: Diagnosis not present

## 2015-08-28 DIAGNOSIS — I82412 Acute embolism and thrombosis of left femoral vein: Secondary | ICD-10-CM | POA: Diagnosis not present

## 2015-08-28 DIAGNOSIS — R6510 Systemic inflammatory response syndrome (SIRS) of non-infectious origin without acute organ dysfunction: Secondary | ICD-10-CM | POA: Diagnosis not present

## 2015-08-28 DIAGNOSIS — Z6827 Body mass index (BMI) 27.0-27.9, adult: Secondary | ICD-10-CM | POA: Diagnosis not present

## 2015-08-28 DIAGNOSIS — F339 Major depressive disorder, recurrent, unspecified: Secondary | ICD-10-CM | POA: Diagnosis not present

## 2015-08-28 DIAGNOSIS — E038 Other specified hypothyroidism: Secondary | ICD-10-CM | POA: Diagnosis not present

## 2015-08-28 DIAGNOSIS — I10 Essential (primary) hypertension: Secondary | ICD-10-CM | POA: Diagnosis not present

## 2015-08-28 DIAGNOSIS — M546 Pain in thoracic spine: Secondary | ICD-10-CM | POA: Diagnosis not present

## 2015-08-28 DIAGNOSIS — M1990 Unspecified osteoarthritis, unspecified site: Secondary | ICD-10-CM | POA: Diagnosis not present

## 2015-08-28 DIAGNOSIS — G894 Chronic pain syndrome: Secondary | ICD-10-CM | POA: Diagnosis not present

## 2015-08-28 DIAGNOSIS — R918 Other nonspecific abnormal finding of lung field: Secondary | ICD-10-CM | POA: Diagnosis not present

## 2015-08-28 DIAGNOSIS — L89322 Pressure ulcer of left buttock, stage 2: Secondary | ICD-10-CM | POA: Diagnosis not present

## 2015-08-28 DIAGNOSIS — K219 Gastro-esophageal reflux disease without esophagitis: Secondary | ICD-10-CM | POA: Diagnosis not present

## 2015-08-28 DIAGNOSIS — D519 Vitamin B12 deficiency anemia, unspecified: Secondary | ICD-10-CM | POA: Diagnosis not present

## 2015-08-28 DIAGNOSIS — M62838 Other muscle spasm: Secondary | ICD-10-CM | POA: Diagnosis not present

## 2015-08-28 DIAGNOSIS — Z7901 Long term (current) use of anticoagulants: Secondary | ICD-10-CM | POA: Diagnosis not present

## 2015-08-28 DIAGNOSIS — R52 Pain, unspecified: Secondary | ICD-10-CM | POA: Diagnosis not present

## 2015-08-28 DIAGNOSIS — M4726 Other spondylosis with radiculopathy, lumbar region: Secondary | ICD-10-CM | POA: Diagnosis not present

## 2015-08-28 DIAGNOSIS — R569 Unspecified convulsions: Secondary | ICD-10-CM | POA: Diagnosis not present

## 2015-08-28 DIAGNOSIS — E559 Vitamin D deficiency, unspecified: Secondary | ICD-10-CM | POA: Diagnosis not present

## 2015-08-28 DIAGNOSIS — D649 Anemia, unspecified: Secondary | ICD-10-CM | POA: Diagnosis not present

## 2015-08-28 DIAGNOSIS — E039 Hypothyroidism, unspecified: Secondary | ICD-10-CM | POA: Diagnosis not present

## 2015-08-28 DIAGNOSIS — K921 Melena: Secondary | ICD-10-CM | POA: Diagnosis not present

## 2015-08-28 DIAGNOSIS — R279 Unspecified lack of coordination: Secondary | ICD-10-CM | POA: Diagnosis not present

## 2015-08-28 DIAGNOSIS — K5900 Constipation, unspecified: Secondary | ICD-10-CM | POA: Diagnosis not present

## 2015-08-28 DIAGNOSIS — C3411 Malignant neoplasm of upper lobe, right bronchus or lung: Secondary | ICD-10-CM | POA: Diagnosis not present

## 2015-08-28 DIAGNOSIS — R627 Adult failure to thrive: Secondary | ICD-10-CM | POA: Diagnosis not present

## 2015-08-28 DIAGNOSIS — D62 Acute posthemorrhagic anemia: Secondary | ICD-10-CM | POA: Diagnosis not present

## 2015-08-28 DIAGNOSIS — M545 Low back pain: Secondary | ICD-10-CM | POA: Diagnosis not present

## 2015-08-28 DIAGNOSIS — N390 Urinary tract infection, site not specified: Secondary | ICD-10-CM | POA: Diagnosis not present

## 2015-08-28 DIAGNOSIS — R4189 Other symptoms and signs involving cognitive functions and awareness: Secondary | ICD-10-CM | POA: Diagnosis not present

## 2015-08-28 DIAGNOSIS — R262 Difficulty in walking, not elsewhere classified: Secondary | ICD-10-CM | POA: Diagnosis not present

## 2015-08-28 DIAGNOSIS — I824Y2 Acute embolism and thrombosis of unspecified deep veins of left proximal lower extremity: Secondary | ICD-10-CM | POA: Diagnosis not present

## 2015-08-28 DIAGNOSIS — Z96642 Presence of left artificial hip joint: Secondary | ICD-10-CM | POA: Diagnosis not present

## 2015-08-28 DIAGNOSIS — M6281 Muscle weakness (generalized): Secondary | ICD-10-CM | POA: Diagnosis not present

## 2015-08-28 DIAGNOSIS — R911 Solitary pulmonary nodule: Secondary | ICD-10-CM | POA: Diagnosis not present

## 2015-08-29 DIAGNOSIS — E039 Hypothyroidism, unspecified: Secondary | ICD-10-CM | POA: Diagnosis not present

## 2015-08-29 DIAGNOSIS — G894 Chronic pain syndrome: Secondary | ICD-10-CM | POA: Diagnosis not present

## 2015-08-29 DIAGNOSIS — R918 Other nonspecific abnormal finding of lung field: Secondary | ICD-10-CM | POA: Diagnosis not present

## 2015-08-29 DIAGNOSIS — M546 Pain in thoracic spine: Secondary | ICD-10-CM | POA: Diagnosis not present

## 2015-08-29 DIAGNOSIS — R52 Pain, unspecified: Secondary | ICD-10-CM | POA: Diagnosis not present

## 2015-09-01 DIAGNOSIS — M62838 Other muscle spasm: Secondary | ICD-10-CM | POA: Diagnosis not present

## 2015-09-03 DIAGNOSIS — R262 Difficulty in walking, not elsewhere classified: Secondary | ICD-10-CM | POA: Diagnosis not present

## 2015-09-03 DIAGNOSIS — M545 Low back pain: Secondary | ICD-10-CM | POA: Diagnosis not present

## 2015-09-05 DIAGNOSIS — C3411 Malignant neoplasm of upper lobe, right bronchus or lung: Secondary | ICD-10-CM

## 2015-09-05 HISTORY — DX: Malignant neoplasm of upper lobe, right bronchus or lung: C34.11

## 2015-09-22 DIAGNOSIS — R600 Localized edema: Secondary | ICD-10-CM | POA: Diagnosis not present

## 2015-09-22 DIAGNOSIS — R1907 Generalized intra-abdominal and pelvic swelling, mass and lump: Secondary | ICD-10-CM | POA: Diagnosis not present

## 2015-09-22 DIAGNOSIS — R911 Solitary pulmonary nodule: Secondary | ICD-10-CM | POA: Diagnosis not present

## 2015-09-22 DIAGNOSIS — Z86718 Personal history of other venous thrombosis and embolism: Secondary | ICD-10-CM | POA: Diagnosis not present

## 2015-09-22 DIAGNOSIS — Z7901 Long term (current) use of anticoagulants: Secondary | ICD-10-CM | POA: Diagnosis not present

## 2015-09-22 DIAGNOSIS — Z79899 Other long term (current) drug therapy: Secondary | ICD-10-CM | POA: Diagnosis not present

## 2015-09-22 DIAGNOSIS — M79605 Pain in left leg: Secondary | ICD-10-CM | POA: Diagnosis not present

## 2015-09-22 DIAGNOSIS — C3411 Malignant neoplasm of upper lobe, right bronchus or lung: Secondary | ICD-10-CM | POA: Diagnosis not present

## 2015-09-22 DIAGNOSIS — Z87891 Personal history of nicotine dependence: Secondary | ICD-10-CM | POA: Diagnosis not present

## 2015-09-22 DIAGNOSIS — Z7982 Long term (current) use of aspirin: Secondary | ICD-10-CM | POA: Diagnosis not present

## 2015-09-22 DIAGNOSIS — R0602 Shortness of breath: Secondary | ICD-10-CM | POA: Diagnosis not present

## 2015-09-22 DIAGNOSIS — I10 Essential (primary) hypertension: Secondary | ICD-10-CM | POA: Diagnosis not present

## 2015-09-24 DIAGNOSIS — G894 Chronic pain syndrome: Secondary | ICD-10-CM | POA: Diagnosis not present

## 2015-09-24 DIAGNOSIS — M150 Primary generalized (osteo)arthritis: Secondary | ICD-10-CM | POA: Diagnosis not present

## 2015-09-24 DIAGNOSIS — D649 Anemia, unspecified: Secondary | ICD-10-CM | POA: Diagnosis not present

## 2015-09-24 DIAGNOSIS — Z86718 Personal history of other venous thrombosis and embolism: Secondary | ICD-10-CM | POA: Diagnosis not present

## 2015-09-24 DIAGNOSIS — D509 Iron deficiency anemia, unspecified: Secondary | ICD-10-CM | POA: Diagnosis not present

## 2015-09-24 DIAGNOSIS — I10 Essential (primary) hypertension: Secondary | ICD-10-CM | POA: Diagnosis not present

## 2015-09-24 DIAGNOSIS — Z86711 Personal history of pulmonary embolism: Secondary | ICD-10-CM | POA: Diagnosis not present

## 2015-09-24 DIAGNOSIS — F339 Major depressive disorder, recurrent, unspecified: Secondary | ICD-10-CM | POA: Diagnosis not present

## 2015-09-24 DIAGNOSIS — Z87440 Personal history of urinary (tract) infections: Secondary | ICD-10-CM | POA: Diagnosis not present

## 2015-09-24 DIAGNOSIS — J449 Chronic obstructive pulmonary disease, unspecified: Secondary | ICD-10-CM | POA: Diagnosis not present

## 2015-09-24 DIAGNOSIS — L89152 Pressure ulcer of sacral region, stage 2: Secondary | ICD-10-CM | POA: Diagnosis not present

## 2015-09-24 DIAGNOSIS — M4726 Other spondylosis with radiculopathy, lumbar region: Secondary | ICD-10-CM | POA: Diagnosis not present

## 2015-09-24 DIAGNOSIS — C3490 Malignant neoplasm of unspecified part of unspecified bronchus or lung: Secondary | ICD-10-CM | POA: Diagnosis not present

## 2015-09-24 DIAGNOSIS — C3491 Malignant neoplasm of unspecified part of right bronchus or lung: Secondary | ICD-10-CM | POA: Diagnosis not present

## 2015-09-24 DIAGNOSIS — R600 Localized edema: Secondary | ICD-10-CM | POA: Diagnosis not present

## 2015-09-24 DIAGNOSIS — K219 Gastro-esophageal reflux disease without esophagitis: Secondary | ICD-10-CM | POA: Diagnosis not present

## 2015-09-26 DIAGNOSIS — D649 Anemia, unspecified: Secondary | ICD-10-CM | POA: Diagnosis not present

## 2015-09-26 DIAGNOSIS — J449 Chronic obstructive pulmonary disease, unspecified: Secondary | ICD-10-CM | POA: Diagnosis not present

## 2015-09-26 DIAGNOSIS — L89152 Pressure ulcer of sacral region, stage 2: Secondary | ICD-10-CM | POA: Diagnosis not present

## 2015-09-26 DIAGNOSIS — G894 Chronic pain syndrome: Secondary | ICD-10-CM | POA: Diagnosis not present

## 2015-09-26 DIAGNOSIS — I10 Essential (primary) hypertension: Secondary | ICD-10-CM | POA: Diagnosis not present

## 2015-09-26 DIAGNOSIS — C3490 Malignant neoplasm of unspecified part of unspecified bronchus or lung: Secondary | ICD-10-CM | POA: Diagnosis not present

## 2015-09-29 DIAGNOSIS — D649 Anemia, unspecified: Secondary | ICD-10-CM | POA: Diagnosis not present

## 2015-09-29 DIAGNOSIS — G894 Chronic pain syndrome: Secondary | ICD-10-CM | POA: Diagnosis not present

## 2015-09-29 DIAGNOSIS — L89152 Pressure ulcer of sacral region, stage 2: Secondary | ICD-10-CM | POA: Diagnosis not present

## 2015-09-29 DIAGNOSIS — C3490 Malignant neoplasm of unspecified part of unspecified bronchus or lung: Secondary | ICD-10-CM | POA: Diagnosis not present

## 2015-09-29 DIAGNOSIS — J449 Chronic obstructive pulmonary disease, unspecified: Secondary | ICD-10-CM | POA: Diagnosis not present

## 2015-09-29 DIAGNOSIS — I10 Essential (primary) hypertension: Secondary | ICD-10-CM | POA: Diagnosis not present

## 2015-10-01 DIAGNOSIS — C3491 Malignant neoplasm of unspecified part of right bronchus or lung: Secondary | ICD-10-CM | POA: Diagnosis not present

## 2015-10-01 DIAGNOSIS — R600 Localized edema: Secondary | ICD-10-CM | POA: Diagnosis not present

## 2015-10-01 DIAGNOSIS — D509 Iron deficiency anemia, unspecified: Secondary | ICD-10-CM | POA: Diagnosis not present

## 2015-10-02 DIAGNOSIS — G894 Chronic pain syndrome: Secondary | ICD-10-CM | POA: Diagnosis not present

## 2015-10-02 DIAGNOSIS — J449 Chronic obstructive pulmonary disease, unspecified: Secondary | ICD-10-CM | POA: Diagnosis not present

## 2015-10-02 DIAGNOSIS — L89152 Pressure ulcer of sacral region, stage 2: Secondary | ICD-10-CM | POA: Diagnosis not present

## 2015-10-02 DIAGNOSIS — C3490 Malignant neoplasm of unspecified part of unspecified bronchus or lung: Secondary | ICD-10-CM | POA: Diagnosis not present

## 2015-10-02 DIAGNOSIS — D649 Anemia, unspecified: Secondary | ICD-10-CM | POA: Diagnosis not present

## 2015-10-02 DIAGNOSIS — I10 Essential (primary) hypertension: Secondary | ICD-10-CM | POA: Diagnosis not present

## 2015-10-03 DIAGNOSIS — I10 Essential (primary) hypertension: Secondary | ICD-10-CM | POA: Diagnosis not present

## 2015-10-03 DIAGNOSIS — C3490 Malignant neoplasm of unspecified part of unspecified bronchus or lung: Secondary | ICD-10-CM | POA: Diagnosis not present

## 2015-10-03 DIAGNOSIS — G894 Chronic pain syndrome: Secondary | ICD-10-CM | POA: Diagnosis not present

## 2015-10-03 DIAGNOSIS — J449 Chronic obstructive pulmonary disease, unspecified: Secondary | ICD-10-CM | POA: Diagnosis not present

## 2015-10-03 DIAGNOSIS — L89152 Pressure ulcer of sacral region, stage 2: Secondary | ICD-10-CM | POA: Diagnosis not present

## 2015-10-03 DIAGNOSIS — D649 Anemia, unspecified: Secondary | ICD-10-CM | POA: Diagnosis not present

## 2015-10-06 DIAGNOSIS — J449 Chronic obstructive pulmonary disease, unspecified: Secondary | ICD-10-CM | POA: Diagnosis not present

## 2015-10-06 DIAGNOSIS — D649 Anemia, unspecified: Secondary | ICD-10-CM | POA: Diagnosis not present

## 2015-10-06 DIAGNOSIS — I10 Essential (primary) hypertension: Secondary | ICD-10-CM | POA: Diagnosis not present

## 2015-10-06 DIAGNOSIS — G894 Chronic pain syndrome: Secondary | ICD-10-CM | POA: Diagnosis not present

## 2015-10-06 DIAGNOSIS — L89152 Pressure ulcer of sacral region, stage 2: Secondary | ICD-10-CM | POA: Diagnosis not present

## 2015-10-06 DIAGNOSIS — C3490 Malignant neoplasm of unspecified part of unspecified bronchus or lung: Secondary | ICD-10-CM | POA: Diagnosis not present

## 2015-10-08 DIAGNOSIS — C3490 Malignant neoplasm of unspecified part of unspecified bronchus or lung: Secondary | ICD-10-CM | POA: Diagnosis not present

## 2015-10-08 DIAGNOSIS — I10 Essential (primary) hypertension: Secondary | ICD-10-CM | POA: Diagnosis not present

## 2015-10-08 DIAGNOSIS — L89152 Pressure ulcer of sacral region, stage 2: Secondary | ICD-10-CM | POA: Diagnosis not present

## 2015-10-08 DIAGNOSIS — G894 Chronic pain syndrome: Secondary | ICD-10-CM | POA: Diagnosis not present

## 2015-10-08 DIAGNOSIS — J449 Chronic obstructive pulmonary disease, unspecified: Secondary | ICD-10-CM | POA: Diagnosis not present

## 2015-10-08 DIAGNOSIS — D649 Anemia, unspecified: Secondary | ICD-10-CM | POA: Diagnosis not present

## 2015-10-09 DIAGNOSIS — C3490 Malignant neoplasm of unspecified part of unspecified bronchus or lung: Secondary | ICD-10-CM | POA: Diagnosis not present

## 2015-10-09 DIAGNOSIS — G894 Chronic pain syndrome: Secondary | ICD-10-CM | POA: Diagnosis not present

## 2015-10-09 DIAGNOSIS — D649 Anemia, unspecified: Secondary | ICD-10-CM | POA: Diagnosis not present

## 2015-10-09 DIAGNOSIS — L89152 Pressure ulcer of sacral region, stage 2: Secondary | ICD-10-CM | POA: Diagnosis not present

## 2015-10-09 DIAGNOSIS — I10 Essential (primary) hypertension: Secondary | ICD-10-CM | POA: Diagnosis not present

## 2015-10-09 DIAGNOSIS — J449 Chronic obstructive pulmonary disease, unspecified: Secondary | ICD-10-CM | POA: Diagnosis not present

## 2015-10-13 DIAGNOSIS — I10 Essential (primary) hypertension: Secondary | ICD-10-CM | POA: Diagnosis not present

## 2015-10-13 DIAGNOSIS — D649 Anemia, unspecified: Secondary | ICD-10-CM | POA: Diagnosis not present

## 2015-10-13 DIAGNOSIS — L89152 Pressure ulcer of sacral region, stage 2: Secondary | ICD-10-CM | POA: Diagnosis not present

## 2015-10-13 DIAGNOSIS — C3490 Malignant neoplasm of unspecified part of unspecified bronchus or lung: Secondary | ICD-10-CM | POA: Diagnosis not present

## 2015-10-13 DIAGNOSIS — G894 Chronic pain syndrome: Secondary | ICD-10-CM | POA: Diagnosis not present

## 2015-10-13 DIAGNOSIS — J449 Chronic obstructive pulmonary disease, unspecified: Secondary | ICD-10-CM | POA: Diagnosis not present

## 2015-10-15 DIAGNOSIS — R911 Solitary pulmonary nodule: Secondary | ICD-10-CM | POA: Diagnosis not present

## 2015-10-15 DIAGNOSIS — C3411 Malignant neoplasm of upper lobe, right bronchus or lung: Secondary | ICD-10-CM | POA: Diagnosis not present

## 2015-10-15 DIAGNOSIS — Z79899 Other long term (current) drug therapy: Secondary | ICD-10-CM | POA: Diagnosis not present

## 2015-10-16 DIAGNOSIS — C3490 Malignant neoplasm of unspecified part of unspecified bronchus or lung: Secondary | ICD-10-CM | POA: Diagnosis not present

## 2015-10-17 DIAGNOSIS — C3490 Malignant neoplasm of unspecified part of unspecified bronchus or lung: Secondary | ICD-10-CM | POA: Diagnosis not present

## 2015-10-17 DIAGNOSIS — J449 Chronic obstructive pulmonary disease, unspecified: Secondary | ICD-10-CM | POA: Diagnosis not present

## 2015-10-17 DIAGNOSIS — I10 Essential (primary) hypertension: Secondary | ICD-10-CM | POA: Diagnosis not present

## 2015-10-17 DIAGNOSIS — G894 Chronic pain syndrome: Secondary | ICD-10-CM | POA: Diagnosis not present

## 2015-10-17 DIAGNOSIS — L89152 Pressure ulcer of sacral region, stage 2: Secondary | ICD-10-CM | POA: Diagnosis not present

## 2015-10-17 DIAGNOSIS — D649 Anemia, unspecified: Secondary | ICD-10-CM | POA: Diagnosis not present

## 2015-10-23 DIAGNOSIS — R911 Solitary pulmonary nodule: Secondary | ICD-10-CM | POA: Diagnosis not present

## 2015-10-23 DIAGNOSIS — C3411 Malignant neoplasm of upper lobe, right bronchus or lung: Secondary | ICD-10-CM | POA: Diagnosis not present

## 2015-10-23 DIAGNOSIS — Z79899 Other long term (current) drug therapy: Secondary | ICD-10-CM | POA: Diagnosis not present

## 2015-10-30 DIAGNOSIS — Z79899 Other long term (current) drug therapy: Secondary | ICD-10-CM | POA: Diagnosis not present

## 2015-10-30 DIAGNOSIS — R911 Solitary pulmonary nodule: Secondary | ICD-10-CM | POA: Diagnosis not present

## 2015-10-30 DIAGNOSIS — C3411 Malignant neoplasm of upper lobe, right bronchus or lung: Secondary | ICD-10-CM | POA: Diagnosis not present

## 2015-11-04 DIAGNOSIS — C3411 Malignant neoplasm of upper lobe, right bronchus or lung: Secondary | ICD-10-CM | POA: Diagnosis not present

## 2015-11-04 DIAGNOSIS — R911 Solitary pulmonary nodule: Secondary | ICD-10-CM | POA: Diagnosis not present

## 2015-11-04 DIAGNOSIS — Z79899 Other long term (current) drug therapy: Secondary | ICD-10-CM | POA: Diagnosis not present

## 2015-11-06 DIAGNOSIS — Z79899 Other long term (current) drug therapy: Secondary | ICD-10-CM | POA: Diagnosis not present

## 2015-11-06 DIAGNOSIS — C3411 Malignant neoplasm of upper lobe, right bronchus or lung: Secondary | ICD-10-CM | POA: Diagnosis not present

## 2015-11-06 DIAGNOSIS — R911 Solitary pulmonary nodule: Secondary | ICD-10-CM | POA: Diagnosis not present

## 2015-11-11 DIAGNOSIS — C3411 Malignant neoplasm of upper lobe, right bronchus or lung: Secondary | ICD-10-CM | POA: Diagnosis not present

## 2015-11-11 DIAGNOSIS — Z79899 Other long term (current) drug therapy: Secondary | ICD-10-CM | POA: Diagnosis not present

## 2015-11-11 DIAGNOSIS — R911 Solitary pulmonary nodule: Secondary | ICD-10-CM | POA: Diagnosis not present

## 2015-11-17 DIAGNOSIS — J449 Chronic obstructive pulmonary disease, unspecified: Secondary | ICD-10-CM | POA: Diagnosis not present

## 2015-11-17 DIAGNOSIS — I2699 Other pulmonary embolism without acute cor pulmonale: Secondary | ICD-10-CM | POA: Diagnosis not present

## 2015-11-17 DIAGNOSIS — C3411 Malignant neoplasm of upper lobe, right bronchus or lung: Secondary | ICD-10-CM | POA: Diagnosis not present

## 2015-11-17 DIAGNOSIS — J432 Centrilobular emphysema: Secondary | ICD-10-CM | POA: Diagnosis not present

## 2015-11-17 DIAGNOSIS — R0902 Hypoxemia: Secondary | ICD-10-CM

## 2015-11-17 HISTORY — DX: Hypoxemia: R09.02

## 2015-11-19 DIAGNOSIS — C3412 Malignant neoplasm of upper lobe, left bronchus or lung: Secondary | ICD-10-CM | POA: Diagnosis not present

## 2015-11-21 DIAGNOSIS — R300 Dysuria: Secondary | ICD-10-CM | POA: Diagnosis not present

## 2015-12-03 DIAGNOSIS — G8929 Other chronic pain: Secondary | ICD-10-CM | POA: Diagnosis not present

## 2015-12-03 DIAGNOSIS — M549 Dorsalgia, unspecified: Secondary | ICD-10-CM | POA: Diagnosis not present

## 2015-12-03 DIAGNOSIS — C3412 Malignant neoplasm of upper lobe, left bronchus or lung: Secondary | ICD-10-CM | POA: Diagnosis not present

## 2015-12-15 DIAGNOSIS — H04123 Dry eye syndrome of bilateral lacrimal glands: Secondary | ICD-10-CM | POA: Diagnosis not present

## 2015-12-15 DIAGNOSIS — H26492 Other secondary cataract, left eye: Secondary | ICD-10-CM | POA: Diagnosis not present

## 2015-12-25 DIAGNOSIS — R911 Solitary pulmonary nodule: Secondary | ICD-10-CM | POA: Diagnosis not present

## 2015-12-25 DIAGNOSIS — C3411 Malignant neoplasm of upper lobe, right bronchus or lung: Secondary | ICD-10-CM | POA: Diagnosis not present

## 2015-12-25 DIAGNOSIS — Z79899 Other long term (current) drug therapy: Secondary | ICD-10-CM | POA: Diagnosis not present

## 2016-01-02 DIAGNOSIS — G8929 Other chronic pain: Secondary | ICD-10-CM | POA: Diagnosis not present

## 2016-01-02 DIAGNOSIS — M549 Dorsalgia, unspecified: Secondary | ICD-10-CM | POA: Diagnosis not present

## 2016-02-26 DIAGNOSIS — R591 Generalized enlarged lymph nodes: Secondary | ICD-10-CM | POA: Diagnosis not present

## 2016-02-26 DIAGNOSIS — Z6828 Body mass index (BMI) 28.0-28.9, adult: Secondary | ICD-10-CM | POA: Diagnosis not present

## 2016-02-26 DIAGNOSIS — I2510 Atherosclerotic heart disease of native coronary artery without angina pectoris: Secondary | ICD-10-CM | POA: Diagnosis not present

## 2016-02-26 DIAGNOSIS — Z79899 Other long term (current) drug therapy: Secondary | ICD-10-CM | POA: Diagnosis not present

## 2016-02-26 DIAGNOSIS — C3490 Malignant neoplasm of unspecified part of unspecified bronchus or lung: Secondary | ICD-10-CM | POA: Diagnosis not present

## 2016-02-26 DIAGNOSIS — M47814 Spondylosis without myelopathy or radiculopathy, thoracic region: Secondary | ICD-10-CM | POA: Diagnosis not present

## 2016-02-26 DIAGNOSIS — C3411 Malignant neoplasm of upper lobe, right bronchus or lung: Secondary | ICD-10-CM | POA: Diagnosis not present

## 2016-02-26 DIAGNOSIS — Z923 Personal history of irradiation: Secondary | ICD-10-CM | POA: Diagnosis not present

## 2016-02-26 DIAGNOSIS — R911 Solitary pulmonary nodule: Secondary | ICD-10-CM | POA: Diagnosis not present

## 2016-03-04 DIAGNOSIS — Z1389 Encounter for screening for other disorder: Secondary | ICD-10-CM | POA: Diagnosis not present

## 2016-03-04 DIAGNOSIS — N390 Urinary tract infection, site not specified: Secondary | ICD-10-CM | POA: Diagnosis not present

## 2016-03-04 DIAGNOSIS — M549 Dorsalgia, unspecified: Secondary | ICD-10-CM | POA: Diagnosis not present

## 2016-03-04 DIAGNOSIS — G8929 Other chronic pain: Secondary | ICD-10-CM | POA: Diagnosis not present

## 2016-03-05 DIAGNOSIS — M549 Dorsalgia, unspecified: Secondary | ICD-10-CM | POA: Diagnosis not present

## 2016-03-23 ENCOUNTER — Other Ambulatory Visit: Payer: Self-pay

## 2016-04-12 DIAGNOSIS — Z0000 Encounter for general adult medical examination without abnormal findings: Secondary | ICD-10-CM | POA: Diagnosis not present

## 2016-04-12 DIAGNOSIS — R8299 Other abnormal findings in urine: Secondary | ICD-10-CM | POA: Diagnosis not present

## 2016-04-12 DIAGNOSIS — G8929 Other chronic pain: Secondary | ICD-10-CM | POA: Diagnosis not present

## 2016-04-12 DIAGNOSIS — M549 Dorsalgia, unspecified: Secondary | ICD-10-CM | POA: Diagnosis not present

## 2016-05-12 DIAGNOSIS — G8929 Other chronic pain: Secondary | ICD-10-CM | POA: Diagnosis not present

## 2016-05-12 DIAGNOSIS — M549 Dorsalgia, unspecified: Secondary | ICD-10-CM | POA: Diagnosis not present

## 2016-06-10 DIAGNOSIS — M8580 Other specified disorders of bone density and structure, unspecified site: Secondary | ICD-10-CM | POA: Diagnosis not present

## 2016-06-10 DIAGNOSIS — Z0000 Encounter for general adult medical examination without abnormal findings: Secondary | ICD-10-CM | POA: Diagnosis not present

## 2016-06-10 DIAGNOSIS — M549 Dorsalgia, unspecified: Secondary | ICD-10-CM | POA: Diagnosis not present

## 2016-06-10 DIAGNOSIS — Z85118 Personal history of other malignant neoplasm of bronchus and lung: Secondary | ICD-10-CM | POA: Diagnosis not present

## 2016-06-10 DIAGNOSIS — G8929 Other chronic pain: Secondary | ICD-10-CM | POA: Diagnosis not present

## 2016-06-23 DIAGNOSIS — Z923 Personal history of irradiation: Secondary | ICD-10-CM | POA: Diagnosis not present

## 2016-06-23 DIAGNOSIS — C3411 Malignant neoplasm of upper lobe, right bronchus or lung: Secondary | ICD-10-CM | POA: Diagnosis not present

## 2016-06-23 DIAGNOSIS — C3490 Malignant neoplasm of unspecified part of unspecified bronchus or lung: Secondary | ICD-10-CM | POA: Diagnosis not present

## 2016-06-23 DIAGNOSIS — I700 Atherosclerosis of aorta: Secondary | ICD-10-CM | POA: Diagnosis not present

## 2016-07-24 DIAGNOSIS — I6523 Occlusion and stenosis of bilateral carotid arteries: Secondary | ICD-10-CM

## 2016-07-24 HISTORY — DX: Occlusion and stenosis of bilateral carotid arteries: I65.23

## 2016-07-27 DIAGNOSIS — I2510 Atherosclerotic heart disease of native coronary artery without angina pectoris: Secondary | ICD-10-CM | POA: Diagnosis not present

## 2016-07-27 DIAGNOSIS — Z7901 Long term (current) use of anticoagulants: Secondary | ICD-10-CM | POA: Diagnosis not present

## 2016-07-27 DIAGNOSIS — I25119 Atherosclerotic heart disease of native coronary artery with unspecified angina pectoris: Secondary | ICD-10-CM | POA: Diagnosis not present

## 2016-07-27 DIAGNOSIS — I2584 Coronary atherosclerosis due to calcified coronary lesion: Secondary | ICD-10-CM | POA: Diagnosis not present

## 2016-07-27 DIAGNOSIS — I10 Essential (primary) hypertension: Secondary | ICD-10-CM | POA: Diagnosis not present

## 2016-07-27 DIAGNOSIS — E785 Hyperlipidemia, unspecified: Secondary | ICD-10-CM | POA: Diagnosis not present

## 2016-07-27 DIAGNOSIS — Z6827 Body mass index (BMI) 27.0-27.9, adult: Secondary | ICD-10-CM | POA: Diagnosis not present

## 2016-08-02 DIAGNOSIS — I25119 Atherosclerotic heart disease of native coronary artery with unspecified angina pectoris: Secondary | ICD-10-CM | POA: Diagnosis not present

## 2016-08-04 DIAGNOSIS — G8929 Other chronic pain: Secondary | ICD-10-CM | POA: Diagnosis not present

## 2016-08-04 DIAGNOSIS — N390 Urinary tract infection, site not specified: Secondary | ICD-10-CM | POA: Diagnosis not present

## 2016-08-04 DIAGNOSIS — M549 Dorsalgia, unspecified: Secondary | ICD-10-CM | POA: Diagnosis not present

## 2016-08-16 DIAGNOSIS — R079 Chest pain, unspecified: Secondary | ICD-10-CM | POA: Diagnosis not present

## 2016-08-16 DIAGNOSIS — I25119 Atherosclerotic heart disease of native coronary artery with unspecified angina pectoris: Secondary | ICD-10-CM | POA: Diagnosis not present

## 2016-08-30 DIAGNOSIS — I25119 Atherosclerotic heart disease of native coronary artery with unspecified angina pectoris: Secondary | ICD-10-CM | POA: Diagnosis not present

## 2016-08-30 DIAGNOSIS — I10 Essential (primary) hypertension: Secondary | ICD-10-CM | POA: Diagnosis not present

## 2016-08-30 DIAGNOSIS — I2510 Atherosclerotic heart disease of native coronary artery without angina pectoris: Secondary | ICD-10-CM | POA: Diagnosis not present

## 2016-08-30 DIAGNOSIS — E785 Hyperlipidemia, unspecified: Secondary | ICD-10-CM | POA: Diagnosis not present

## 2016-08-30 DIAGNOSIS — Z7901 Long term (current) use of anticoagulants: Secondary | ICD-10-CM | POA: Diagnosis not present

## 2016-09-10 DIAGNOSIS — K529 Noninfective gastroenteritis and colitis, unspecified: Secondary | ICD-10-CM | POA: Diagnosis not present

## 2016-09-20 DIAGNOSIS — I700 Atherosclerosis of aorta: Secondary | ICD-10-CM | POA: Diagnosis not present

## 2016-09-20 DIAGNOSIS — J439 Emphysema, unspecified: Secondary | ICD-10-CM | POA: Diagnosis not present

## 2016-09-20 DIAGNOSIS — C3491 Malignant neoplasm of unspecified part of right bronchus or lung: Secondary | ICD-10-CM | POA: Diagnosis not present

## 2016-09-20 DIAGNOSIS — C3411 Malignant neoplasm of upper lobe, right bronchus or lung: Secondary | ICD-10-CM | POA: Diagnosis not present

## 2016-09-22 DIAGNOSIS — C3411 Malignant neoplasm of upper lobe, right bronchus or lung: Secondary | ICD-10-CM | POA: Diagnosis not present

## 2016-11-29 DIAGNOSIS — N390 Urinary tract infection, site not specified: Secondary | ICD-10-CM | POA: Diagnosis not present

## 2016-11-29 DIAGNOSIS — Z85118 Personal history of other malignant neoplasm of bronchus and lung: Secondary | ICD-10-CM | POA: Diagnosis not present

## 2016-12-16 DIAGNOSIS — H26493 Other secondary cataract, bilateral: Secondary | ICD-10-CM | POA: Diagnosis not present

## 2016-12-21 DIAGNOSIS — R590 Localized enlarged lymph nodes: Secondary | ICD-10-CM | POA: Diagnosis not present

## 2016-12-21 DIAGNOSIS — C3411 Malignant neoplasm of upper lobe, right bronchus or lung: Secondary | ICD-10-CM | POA: Diagnosis not present

## 2016-12-21 DIAGNOSIS — C3490 Malignant neoplasm of unspecified part of unspecified bronchus or lung: Secondary | ICD-10-CM | POA: Diagnosis not present

## 2016-12-27 DIAGNOSIS — I6523 Occlusion and stenosis of bilateral carotid arteries: Secondary | ICD-10-CM | POA: Diagnosis not present

## 2016-12-27 DIAGNOSIS — I2699 Other pulmonary embolism without acute cor pulmonale: Secondary | ICD-10-CM | POA: Diagnosis not present

## 2016-12-27 DIAGNOSIS — I82409 Acute embolism and thrombosis of unspecified deep veins of unspecified lower extremity: Secondary | ICD-10-CM | POA: Diagnosis not present

## 2016-12-27 DIAGNOSIS — E039 Hypothyroidism, unspecified: Secondary | ICD-10-CM | POA: Diagnosis not present

## 2016-12-27 DIAGNOSIS — M5116 Intervertebral disc disorders with radiculopathy, lumbar region: Secondary | ICD-10-CM | POA: Diagnosis not present

## 2016-12-27 DIAGNOSIS — C3491 Malignant neoplasm of unspecified part of right bronchus or lung: Secondary | ICD-10-CM | POA: Diagnosis not present

## 2016-12-27 DIAGNOSIS — J432 Centrilobular emphysema: Secondary | ICD-10-CM | POA: Diagnosis not present

## 2016-12-27 DIAGNOSIS — M25559 Pain in unspecified hip: Secondary | ICD-10-CM | POA: Diagnosis not present

## 2016-12-27 DIAGNOSIS — K219 Gastro-esophageal reflux disease without esophagitis: Secondary | ICD-10-CM | POA: Diagnosis not present

## 2016-12-27 DIAGNOSIS — R6510 Systemic inflammatory response syndrome (SIRS) of non-infectious origin without acute organ dysfunction: Secondary | ICD-10-CM | POA: Diagnosis not present

## 2016-12-27 DIAGNOSIS — R627 Adult failure to thrive: Secondary | ICD-10-CM | POA: Diagnosis not present

## 2016-12-27 DIAGNOSIS — C3411 Malignant neoplasm of upper lobe, right bronchus or lung: Secondary | ICD-10-CM | POA: Diagnosis not present

## 2016-12-27 DIAGNOSIS — G8929 Other chronic pain: Secondary | ICD-10-CM | POA: Diagnosis not present

## 2016-12-27 DIAGNOSIS — I25119 Atherosclerotic heart disease of native coronary artery with unspecified angina pectoris: Secondary | ICD-10-CM | POA: Diagnosis not present

## 2016-12-27 DIAGNOSIS — I10 Essential (primary) hypertension: Secondary | ICD-10-CM | POA: Diagnosis not present

## 2016-12-27 DIAGNOSIS — Z6827 Body mass index (BMI) 27.0-27.9, adult: Secondary | ICD-10-CM | POA: Diagnosis not present

## 2016-12-27 DIAGNOSIS — R590 Localized enlarged lymph nodes: Secondary | ICD-10-CM | POA: Diagnosis not present

## 2017-01-01 DIAGNOSIS — F331 Major depressive disorder, recurrent, moderate: Secondary | ICD-10-CM | POA: Diagnosis not present

## 2017-01-01 DIAGNOSIS — M4726 Other spondylosis with radiculopathy, lumbar region: Secondary | ICD-10-CM | POA: Diagnosis not present

## 2017-01-01 DIAGNOSIS — R569 Unspecified convulsions: Secondary | ICD-10-CM | POA: Diagnosis not present

## 2017-01-01 DIAGNOSIS — J449 Chronic obstructive pulmonary disease, unspecified: Secondary | ICD-10-CM | POA: Diagnosis not present

## 2017-01-04 DIAGNOSIS — Z85118 Personal history of other malignant neoplasm of bronchus and lung: Secondary | ICD-10-CM | POA: Diagnosis not present

## 2017-01-04 DIAGNOSIS — M810 Age-related osteoporosis without current pathological fracture: Secondary | ICD-10-CM | POA: Diagnosis not present

## 2017-01-04 DIAGNOSIS — M549 Dorsalgia, unspecified: Secondary | ICD-10-CM | POA: Diagnosis not present

## 2017-01-04 DIAGNOSIS — J449 Chronic obstructive pulmonary disease, unspecified: Secondary | ICD-10-CM | POA: Diagnosis not present

## 2017-01-11 DIAGNOSIS — M810 Age-related osteoporosis without current pathological fracture: Secondary | ICD-10-CM | POA: Diagnosis not present

## 2017-01-11 DIAGNOSIS — J449 Chronic obstructive pulmonary disease, unspecified: Secondary | ICD-10-CM | POA: Diagnosis not present

## 2017-01-11 DIAGNOSIS — M549 Dorsalgia, unspecified: Secondary | ICD-10-CM | POA: Diagnosis not present

## 2017-01-11 DIAGNOSIS — R300 Dysuria: Secondary | ICD-10-CM | POA: Diagnosis not present

## 2017-01-12 DIAGNOSIS — R319 Hematuria, unspecified: Secondary | ICD-10-CM | POA: Diagnosis not present

## 2017-01-12 DIAGNOSIS — N390 Urinary tract infection, site not specified: Secondary | ICD-10-CM | POA: Diagnosis not present

## 2017-01-12 DIAGNOSIS — Z79899 Other long term (current) drug therapy: Secondary | ICD-10-CM | POA: Diagnosis not present

## 2017-02-28 ENCOUNTER — Other Ambulatory Visit: Payer: Self-pay

## 2017-02-28 MED ORDER — PRAVASTATIN SODIUM 20 MG PO TABS: 20.0000 mg | ORAL_TABLET | Freq: Every day | ORAL | 1 refills | Status: DC

## 2017-02-28 NOTE — Telephone Encounter (Signed)
Verified that patient will be following Dr. Bettina Gavia in [facility].

## 2017-04-26 DIAGNOSIS — Z23 Encounter for immunization: Secondary | ICD-10-CM | POA: Diagnosis not present

## 2017-04-26 DIAGNOSIS — N3000 Acute cystitis without hematuria: Secondary | ICD-10-CM | POA: Diagnosis not present

## 2017-04-29 DIAGNOSIS — M549 Dorsalgia, unspecified: Secondary | ICD-10-CM | POA: Diagnosis not present

## 2017-04-29 DIAGNOSIS — K5903 Drug induced constipation: Secondary | ICD-10-CM | POA: Diagnosis not present

## 2017-04-29 DIAGNOSIS — G8929 Other chronic pain: Secondary | ICD-10-CM | POA: Diagnosis not present

## 2017-04-29 DIAGNOSIS — Z85118 Personal history of other malignant neoplasm of bronchus and lung: Secondary | ICD-10-CM | POA: Diagnosis not present

## 2017-06-02 DIAGNOSIS — I700 Atherosclerosis of aorta: Secondary | ICD-10-CM | POA: Diagnosis not present

## 2017-06-02 DIAGNOSIS — R911 Solitary pulmonary nodule: Secondary | ICD-10-CM | POA: Diagnosis not present

## 2017-06-02 DIAGNOSIS — C3491 Malignant neoplasm of unspecified part of right bronchus or lung: Secondary | ICD-10-CM | POA: Diagnosis not present

## 2017-06-02 DIAGNOSIS — J8410 Pulmonary fibrosis, unspecified: Secondary | ICD-10-CM | POA: Diagnosis not present

## 2017-06-02 DIAGNOSIS — R590 Localized enlarged lymph nodes: Secondary | ICD-10-CM | POA: Diagnosis not present

## 2017-06-03 DIAGNOSIS — Z9981 Dependence on supplemental oxygen: Secondary | ICD-10-CM | POA: Diagnosis not present

## 2017-06-03 DIAGNOSIS — C3411 Malignant neoplasm of upper lobe, right bronchus or lung: Secondary | ICD-10-CM | POA: Diagnosis not present

## 2017-06-03 DIAGNOSIS — Z923 Personal history of irradiation: Secondary | ICD-10-CM | POA: Diagnosis not present

## 2017-07-07 ENCOUNTER — Other Ambulatory Visit: Payer: Self-pay

## 2017-07-07 MED ORDER — PRAVASTATIN SODIUM 20 MG PO TABS: 20.0000 mg | ORAL_TABLET | Freq: Every day | ORAL | 0 refills | Status: DC

## 2017-08-08 ENCOUNTER — Other Ambulatory Visit: Payer: Self-pay | Admitting: *Deleted

## 2017-08-08 ENCOUNTER — Encounter: Payer: Self-pay | Admitting: *Deleted

## 2017-08-30 DIAGNOSIS — Z7901 Long term (current) use of anticoagulants: Secondary | ICD-10-CM | POA: Insufficient documentation

## 2017-08-30 DIAGNOSIS — I2510 Atherosclerotic heart disease of native coronary artery without angina pectoris: Secondary | ICD-10-CM

## 2017-08-30 HISTORY — DX: Atherosclerotic heart disease of native coronary artery without angina pectoris: I25.10

## 2017-08-30 NOTE — Progress Notes (Signed)
Cardiology Office Note:    Date:  08/31/2017   ID:  [name], DOB April 25, 1941, MRN [id number]  PCP:  Myer Peer, MD  Cardiologist:  Shirlee More, MD    Referring MD: Myer Peer, MD     ASSESSMENT:    1. Hypertensive heart disease without heart failure   2. Coronary artery calcification seen on CAT scan   3. Hyperlipidemia, unspecified hyperlipidemia type   4. Chronic anticoagulation    PLAN:    In order of problems listed above:  1. She still has intermittent episodes of systolics are elevated and I will have her take a low dose of ARB as needed. 2. Stable she has had no anginal discomfort and at this time I would not repeat an ischemia evaluation 3. Stable continue current medical treatment stable continue her anticoagulant with lung cancer   I asked her to discuss with her oncologist at the next CT scan to do an abdominal CT as there is suggestion of cirrhosis of the liver.   Next appointment: 6 months   Medication Adjustments/Labs and Tests Ordered: Current medicines are reviewed at length with the patient today.  Concerns regarding medicines are outlined above.  Orders Placed This Encounter  Procedures  . EKG 12-Lead   Meds ordered this encounter  Medications  . losartan (COZAAR) 50 MG tablet    Sig: Take 0.5 tablets (25 mg total) by mouth daily as needed for up to 30 doses (for systolic > 409 mm Hg).    Dispense:  90 tablet    Refill:  3    Chief Complaint  Patient presents with  . Follow-up    1 year follow up appt     History of Present Illness:    Cheyenne [name] is a 77 y.o. female with a hx of COPD, pulmonary embolismCAC-coronary atherosclerosis, Hyperlipidemia, Hypertension,Peripheral Vascular Disease, S/P PCI, and right lung cancer Stage IA (T1a, N0, M0) treated with XRT on long term anticoagulation  last seen 1 year ago. Compliance with diet, lifestyle and medications: Yes Home blood pressure typically is arrangement intermittently has  systolics greater than 811 Causing apprehension.  We will have her continue to follow blood pressure at home and take a low-dose ARB as needed.  No chest pain shortness of breath palpitation or syncope. Past Medical History:  Diagnosis Date  . Acute deep vein thrombosis (DVT) of femoral vein of left lower extremity ([place]) 07/08/2015  . Acute embolism and thrombosis of unspecified deep veins of left proximal lower extremity ([place]) 08/12/2015  . Acute pulmonary embolism ([place]) 07/08/2015  . Adult hypothyroidism 12/21/2011  . Anemia 08/12/2015  . 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 Arteriosclerosis of coronary artery 12/21/2011   Overview:  2 vessel s/p PCI and Vision stent to ostial RCA and PTCA of DI (02/2009).  Lexiscan MPS (10/20/11) revealed normal perfusion and function, EF 66%.  Last Assessment & Plan:  Relevant Hx: Course: Daily Update: Today's Plan:   . At risk for falls 03/05/2014  . Atherosclerotic cerebrovascular disease 04/10/2012  . Back pain, chronic 04/10/2012  . Bladder infection, chronic 05/29/2014  . BP (high blood pressure) 05/24/2011  . Carotid atherosclerosis, bilateral 07/24/2016  . 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 Chronic kidney disease (CKD), stage III (moderate) ([place]) 05/24/2011  . Chronic pain associated with significant psychosocial dysfunction 11/09/2012  . Chronic pain syndrome 11/09/2012  . Chronic respiratory failure ([place])  01/08/2014  . Coccyalgia 02/08/2013  . 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 COPD with emphysema Gold C 01/08/2014   Arlyce Harman 01/08/2014: FeV1 52% FVC 68% FeV1/FVC 57%    CT Chest 11/2013: emphysematous changes 06/17/2014 CT Chest: no nodule seen, emphysema only    . DDD (degenerative disc disease), lumbar 05/07/2014  . DNR (do not resuscitate) 07/14/2015  . Failure to thrive (0-17) 08/12/2015  . Gastro-esophageal  reflux disease without esophagitis 05/07/2014  . H/O deep venous thrombosis 04/10/2012   Overview:  postoperative Overview:  postoperative   . High risk medication use 03/05/2014  . Hyperlipidemia   . Hypertensive heart disease   . Hypoxemia 11/17/2015  . Iron deficiency 09/14/2011  . Kidney cysts 04/20/2013  . Lower urinary tract infectious disease 04/10/2012  . Major depressive disorder 08/12/2015  . Malignant neoplasm of upper lobe of right lung ([place]) 09/05/2015  . Multi-vessel coronary artery stenosis   . Nerve root pain 05/07/2014  . Opioid type dependence, abuse ([place]) 01/19/2013  . Osteoarthritis of spine with radiculopathy, lumbar region 05/07/2014  . Osteoporosis 12/30/2012  . Personal history of other venous thrombosis and embolism 04/10/2012   Overview:  Overview:  Overview:  postoperative Overview:  postoperative Overview:  postoperative  . PVD (peripheral vascular disease) ([place])   . Recurrent UTI 05/23/2014  . S/P lumbar spine operation 02/03/2012  . Seizure disorder ([place])   . SIRS (systemic inflammatory response syndrome) ([place]) 07/07/2015  . Spondylosis, lumbosacral   . Stricture of artery [facility])     Past Surgical History:  Procedure Laterality Date  . APPENDECTOMY    . BACK SURGERY    . JOINT REPLACEMENT    . SPINAL CORD STIMULATOR IMPLANT  2010  . stent  1990  . VESICOVAGINAL FISTULA CLOSURE W/ TAH  1962    Current Medications: Current Meds  Medication Sig  . albuterol (PROVENTIL) (2.5 MG/3ML) 0.083% nebulizer solution Take 2.5 mg by nebulization every 4 (four) hours as needed for shortness of breath.   . Cholecalciferol 1000 UNITS capsule Take 1,000 Units by mouth daily.  . DULoxetine (CYMBALTA) 30 MG capsule Take 30 mg by mouth 3 (three) times daily. Take 1 capsule three times  A day.  . folic acid (FOLVITE) 1 MG tablet Take 1 mg by mouth daily.  Marland Kitchen levETIRAcetam (KEPPRA) 1000 MG tablet TAKE 1 TABLET BID  . levothyroxine (SYNTHROID, LEVOTHROID) 50 MCG tablet Take 50  mcg by mouth daily before breakfast.  . lidocaine (XYLOCAINE) 5 % ointment 1 application daily as needed for moderate pain.   . Multiple Vitamins-Minerals ([employer]S SPECTRAVITE ADULT 50+ PO) Take 1 tablet by mouth daily.  . nitroGLYCERIN (NITROSTAT) 0.4 MG SL tablet Place 0.4 mg under the tongue every 5 (five) minutes as needed for chest pain.  Marland Kitchen ondansetron (ZOFRAN) 4 MG tablet Take 4 mg by mouth every 8 (eight) hours as needed for vomiting.   . Oxycodone HCl 10 MG TABS Take 10 mg by mouth 3 (three) times daily.   . OXYGEN Inhale into the lungs. 2 lpm  . pantoprazole (PROTONIX) 40 MG tablet Take 40 mg by mouth daily.  . rivaroxaban (XARELTO) 20 MG TABS tablet Take 20 mg by mouth daily.   . simvastatin (ZOCOR) 40 MG tablet Take 40 mg by mouth daily.  . Thiamine HCl (VITAMIN B-1 PO) Take 0.5 tablets by mouth 2 (two) times daily.     Allergies:   Asa [aspirin]; Bee venom; Duloxetine hcl; Hornet venom; Phenergan [promethazine hcl]; Penicillins; and Promethazine  Social History   Socioeconomic History  . Marital status: Single    Spouse name: None  . Number of children: None  . Years of education: None  . Highest education level: None  Social Needs  . Financial resource strain: None  . Food insecurity - worry: None  . Food insecurity - inability: None  . Transportation needs - medical: None  . Transportation needs - non-medical: None  Occupational History  . Occupation: Retired    Comment: [employer]  Tobacco Use  . Smoking status: Former Smoker    Packs/day: 1.50    Years: 12.00    Pack years: 18.00    Types: Cigarettes    Last attempt to quit: 07/27/1995    Years since quitting: 22.1  . Smokeless tobacco: Never Used  Substance and Sexual Activity  . Alcohol use: No    Frequency: Never  . Drug use: No  . Sexual activity: None  Other Topics Concern  . None  Social History Narrative  . None     Family History: The patient's family history includes Heart disease in her  brother, father, and mother. ROS:   Please see the history of present illness.    All other systems reviewed and are negative.  EKGs/Labs/Other Studies Reviewed:    The following studies were reviewed today:  EKG:  EKG ordered today.  The ekg ordered today demonstrates sinus rhythm normal  Chest CTA: IMPRESSION: 1. Similar radiation fibrosis in the right upper lobe. No locally recurrent disease. 2. Similarto slightly improved thoracic adenopathy. 3. Coronary artery atherosclerosis. Aortic Atherosclerosis (ICD10-I70.0). 4. Hepatic morphology which is suspicious for cirrhosis.   Recent Labs: No results found for requested labs within last 8760 hours.  Recent Lipid Panel No results found for: CHOL, TRIG, HDL, CHOLHDL, VLDL, LDLCALC, LDLDIRECT  Physical Exam:    VS:  BP 110/66 (BP Location: Right Arm, Patient Position: Sitting, Cuff Size: Normal)   Pulse 90   Ht 5\' 5"  (1.651 m)   Wt 165 lb 1.9 oz (74.9 kg)   SpO2 (!) 88%   BMI 27.48 kg/m     Wt Readings from Last 3 Encounters:  08/31/17 165 lb 1.9 oz (74.9 kg)  04/01/15 176 lb 6.4 oz (80 kg)  03/11/15 150 lb (68 kg)     GEN: Despite an O2 sat of less than 90 she has no respiratory distress or cyanosis she is on ambulatory oxygen but did not bring it to the office well nourished, well developed in no acute distress HEENT: Normal NECK: No JVD; No carotid bruits LYMPHATICS: No lymphadenopathy CARDIAC: RRR, no murmurs, rubs, gallops RESPIRATORY:  Clear to auscultation without rales, wheezing or rhonchi  ABDOMEN: Soft, non-tender, non-distended MUSCULOSKELETAL:  No edema; No deformity  SKIN: Warm and dry NEUROLOGIC:  Alert and oriented x 3 PSYCHIATRIC:  Normal affect    Signed, Shirlee More, MD  08/31/2017 3:11 PM    [place]

## 2017-08-31 ENCOUNTER — Encounter: Payer: Self-pay | Admitting: Cardiology

## 2017-08-31 ENCOUNTER — Ambulatory Visit (INDEPENDENT_AMBULATORY_CARE_PROVIDER_SITE_OTHER): Payer: Medicare Other | Admitting: Cardiology

## 2017-08-31 VITALS — BP 110/66 | HR 90 | Ht 65.0 in | Wt 165.1 lb

## 2017-08-31 DIAGNOSIS — I2510 Atherosclerotic heart disease of native coronary artery without angina pectoris: Secondary | ICD-10-CM

## 2017-08-31 DIAGNOSIS — I119 Hypertensive heart disease without heart failure: Secondary | ICD-10-CM

## 2017-08-31 DIAGNOSIS — E785 Hyperlipidemia, unspecified: Secondary | ICD-10-CM | POA: Diagnosis not present

## 2017-08-31 DIAGNOSIS — Z7901 Long term (current) use of anticoagulants: Secondary | ICD-10-CM

## 2017-08-31 MED ORDER — LOSARTAN POTASSIUM 50 MG PO TABS: 25.0000 mg | ORAL_TABLET | Freq: Every day | ORAL | 3 refills | Status: DC | PRN

## 2017-08-31 NOTE — Patient Instructions (Signed)
Medication Instructions:  Your physician has recommended you make the following change in your medication:  START losartan 25 mg (0.5 tablet) daily as needed if systolic blood pressure (top number) greater than 150  Labwork: None  Testing/Procedures: You had an EKG today.  Follow-Up: Your physician wants you to follow-up in: 1 year. You will receive a reminder letter in the mail two months in advance. If you don't receive a letter, please call our office to schedule the follow-up appointment.  Any Other Special Instructions Will Be Listed Below (If Applicable).     If you need a refill on your cardiac medications before your next appointment, please call your pharmacy.

## 2017-09-01 DIAGNOSIS — M961 Postlaminectomy syndrome, not elsewhere classified: Secondary | ICD-10-CM | POA: Diagnosis not present

## 2017-09-01 DIAGNOSIS — M792 Neuralgia and neuritis, unspecified: Secondary | ICD-10-CM | POA: Diagnosis not present

## 2017-09-01 DIAGNOSIS — Z79899 Other long term (current) drug therapy: Secondary | ICD-10-CM | POA: Diagnosis not present

## 2017-09-01 DIAGNOSIS — G894 Chronic pain syndrome: Secondary | ICD-10-CM | POA: Diagnosis not present

## 2017-09-07 DIAGNOSIS — Z79899 Other long term (current) drug therapy: Secondary | ICD-10-CM | POA: Diagnosis not present

## 2017-09-07 DIAGNOSIS — D519 Vitamin B12 deficiency anemia, unspecified: Secondary | ICD-10-CM | POA: Diagnosis not present

## 2017-09-07 DIAGNOSIS — Z0000 Encounter for general adult medical examination without abnormal findings: Secondary | ICD-10-CM | POA: Diagnosis not present

## 2017-09-07 DIAGNOSIS — D649 Anemia, unspecified: Secondary | ICD-10-CM | POA: Diagnosis not present

## 2017-09-07 DIAGNOSIS — E611 Iron deficiency: Secondary | ICD-10-CM | POA: Diagnosis not present

## 2017-09-07 DIAGNOSIS — E039 Hypothyroidism, unspecified: Secondary | ICD-10-CM | POA: Diagnosis not present

## 2017-09-27 DIAGNOSIS — G894 Chronic pain syndrome: Secondary | ICD-10-CM | POA: Diagnosis not present

## 2017-09-27 DIAGNOSIS — Z9689 Presence of other specified functional implants: Secondary | ICD-10-CM | POA: Diagnosis not present

## 2017-09-27 DIAGNOSIS — M792 Neuralgia and neuritis, unspecified: Secondary | ICD-10-CM | POA: Diagnosis not present

## 2017-09-27 DIAGNOSIS — Z79899 Other long term (current) drug therapy: Secondary | ICD-10-CM | POA: Diagnosis not present

## 2017-09-27 DIAGNOSIS — M961 Postlaminectomy syndrome, not elsewhere classified: Secondary | ICD-10-CM | POA: Diagnosis not present

## 2017-09-28 DIAGNOSIS — L89309 Pressure ulcer of unspecified buttock, unspecified stage: Secondary | ICD-10-CM | POA: Diagnosis not present

## 2017-09-28 DIAGNOSIS — J069 Acute upper respiratory infection, unspecified: Secondary | ICD-10-CM | POA: Diagnosis not present

## 2017-10-28 DIAGNOSIS — Z9689 Presence of other specified functional implants: Secondary | ICD-10-CM | POA: Diagnosis not present

## 2017-10-28 DIAGNOSIS — G894 Chronic pain syndrome: Secondary | ICD-10-CM | POA: Diagnosis not present

## 2017-10-28 DIAGNOSIS — Z79899 Other long term (current) drug therapy: Secondary | ICD-10-CM | POA: Diagnosis not present

## 2017-10-28 DIAGNOSIS — M961 Postlaminectomy syndrome, not elsewhere classified: Secondary | ICD-10-CM | POA: Diagnosis not present

## 2017-10-28 DIAGNOSIS — M792 Neuralgia and neuritis, unspecified: Secondary | ICD-10-CM | POA: Diagnosis not present

## 2017-11-25 DIAGNOSIS — G893 Neoplasm related pain (acute) (chronic): Secondary | ICD-10-CM | POA: Diagnosis not present

## 2017-11-25 DIAGNOSIS — M961 Postlaminectomy syndrome, not elsewhere classified: Secondary | ICD-10-CM | POA: Diagnosis not present

## 2017-11-25 DIAGNOSIS — Z9981 Dependence on supplemental oxygen: Secondary | ICD-10-CM | POA: Diagnosis not present

## 2017-11-25 DIAGNOSIS — M792 Neuralgia and neuritis, unspecified: Secondary | ICD-10-CM | POA: Diagnosis not present

## 2017-11-25 DIAGNOSIS — J9611 Chronic respiratory failure with hypoxia: Secondary | ICD-10-CM | POA: Diagnosis not present

## 2017-11-25 DIAGNOSIS — G894 Chronic pain syndrome: Secondary | ICD-10-CM | POA: Diagnosis not present

## 2017-12-14 DIAGNOSIS — C3490 Malignant neoplasm of unspecified part of unspecified bronchus or lung: Secondary | ICD-10-CM | POA: Diagnosis not present

## 2017-12-14 DIAGNOSIS — C3411 Malignant neoplasm of upper lobe, right bronchus or lung: Secondary | ICD-10-CM | POA: Diagnosis not present

## 2017-12-16 DIAGNOSIS — C3411 Malignant neoplasm of upper lobe, right bronchus or lung: Secondary | ICD-10-CM | POA: Diagnosis not present

## 2017-12-20 DIAGNOSIS — H532 Diplopia: Secondary | ICD-10-CM | POA: Diagnosis not present

## 2017-12-20 DIAGNOSIS — H26493 Other secondary cataract, bilateral: Secondary | ICD-10-CM | POA: Diagnosis not present

## 2017-12-23 DIAGNOSIS — M961 Postlaminectomy syndrome, not elsewhere classified: Secondary | ICD-10-CM | POA: Diagnosis not present

## 2017-12-23 DIAGNOSIS — Z79899 Other long term (current) drug therapy: Secondary | ICD-10-CM | POA: Diagnosis not present

## 2017-12-23 DIAGNOSIS — G894 Chronic pain syndrome: Secondary | ICD-10-CM | POA: Diagnosis not present

## 2017-12-23 DIAGNOSIS — J432 Centrilobular emphysema: Secondary | ICD-10-CM | POA: Diagnosis not present

## 2017-12-23 DIAGNOSIS — J9611 Chronic respiratory failure with hypoxia: Secondary | ICD-10-CM | POA: Diagnosis not present

## 2017-12-23 DIAGNOSIS — M792 Neuralgia and neuritis, unspecified: Secondary | ICD-10-CM | POA: Diagnosis not present

## 2017-12-23 DIAGNOSIS — R0902 Hypoxemia: Secondary | ICD-10-CM | POA: Diagnosis not present

## 2017-12-23 DIAGNOSIS — Z9981 Dependence on supplemental oxygen: Secondary | ICD-10-CM | POA: Diagnosis not present

## 2018-01-06 ENCOUNTER — Telehealth: Payer: Self-pay

## 2018-01-06 MED ORDER — SIMVASTATIN 20 MG PO TABS: 20.0000 mg | ORAL_TABLET | Freq: Every day | ORAL | 2 refills | Status: DC

## 2018-01-06 NOTE — Telephone Encounter (Signed)
Confirmed that patient takes simvastatin 20 mg. Sending refill to [facility].

## 2018-02-22 DIAGNOSIS — D519 Vitamin B12 deficiency anemia, unspecified: Secondary | ICD-10-CM | POA: Diagnosis not present

## 2018-02-22 DIAGNOSIS — E039 Hypothyroidism, unspecified: Secondary | ICD-10-CM | POA: Diagnosis not present

## 2018-02-28 DIAGNOSIS — M961 Postlaminectomy syndrome, not elsewhere classified: Secondary | ICD-10-CM | POA: Diagnosis not present

## 2018-02-28 DIAGNOSIS — M792 Neuralgia and neuritis, unspecified: Secondary | ICD-10-CM | POA: Diagnosis not present

## 2018-02-28 DIAGNOSIS — G894 Chronic pain syndrome: Secondary | ICD-10-CM | POA: Diagnosis not present

## 2018-04-18 DIAGNOSIS — G6181 Chronic inflammatory demyelinating polyneuritis: Secondary | ICD-10-CM | POA: Diagnosis not present

## 2018-04-18 DIAGNOSIS — M961 Postlaminectomy syndrome, not elsewhere classified: Secondary | ICD-10-CM | POA: Diagnosis not present

## 2018-04-18 DIAGNOSIS — G894 Chronic pain syndrome: Secondary | ICD-10-CM | POA: Diagnosis not present

## 2018-05-04 DIAGNOSIS — J40 Bronchitis, not specified as acute or chronic: Secondary | ICD-10-CM | POA: Diagnosis not present

## 2018-05-04 DIAGNOSIS — Z23 Encounter for immunization: Secondary | ICD-10-CM | POA: Diagnosis not present

## 2018-05-18 DIAGNOSIS — R05 Cough: Secondary | ICD-10-CM | POA: Diagnosis not present

## 2018-05-18 DIAGNOSIS — R062 Wheezing: Secondary | ICD-10-CM | POA: Diagnosis not present

## 2018-05-18 DIAGNOSIS — J441 Chronic obstructive pulmonary disease with (acute) exacerbation: Secondary | ICD-10-CM | POA: Diagnosis not present

## 2018-06-01 DIAGNOSIS — J181 Lobar pneumonia, unspecified organism: Secondary | ICD-10-CM | POA: Diagnosis not present

## 2018-06-01 DIAGNOSIS — R05 Cough: Secondary | ICD-10-CM | POA: Diagnosis not present

## 2018-06-01 DIAGNOSIS — J441 Chronic obstructive pulmonary disease with (acute) exacerbation: Secondary | ICD-10-CM | POA: Diagnosis not present

## 2018-06-14 DIAGNOSIS — I358 Other nonrheumatic aortic valve disorders: Secondary | ICD-10-CM | POA: Diagnosis not present

## 2018-06-14 DIAGNOSIS — I700 Atherosclerosis of aorta: Secondary | ICD-10-CM | POA: Diagnosis not present

## 2018-06-14 DIAGNOSIS — C3411 Malignant neoplasm of upper lobe, right bronchus or lung: Secondary | ICD-10-CM | POA: Diagnosis not present

## 2018-06-14 DIAGNOSIS — J439 Emphysema, unspecified: Secondary | ICD-10-CM | POA: Diagnosis not present

## 2018-06-14 DIAGNOSIS — I2510 Atherosclerotic heart disease of native coronary artery without angina pectoris: Secondary | ICD-10-CM | POA: Diagnosis not present

## 2018-06-16 DIAGNOSIS — C3411 Malignant neoplasm of upper lobe, right bronchus or lung: Secondary | ICD-10-CM | POA: Diagnosis not present

## 2018-06-20 DIAGNOSIS — J9611 Chronic respiratory failure with hypoxia: Secondary | ICD-10-CM | POA: Diagnosis not present

## 2018-06-20 DIAGNOSIS — J441 Chronic obstructive pulmonary disease with (acute) exacerbation: Secondary | ICD-10-CM | POA: Diagnosis not present

## 2018-07-06 ENCOUNTER — Other Ambulatory Visit: Payer: Self-pay | Admitting: Cardiology

## 2018-07-11 DIAGNOSIS — M961 Postlaminectomy syndrome, not elsewhere classified: Secondary | ICD-10-CM | POA: Diagnosis not present

## 2018-07-11 DIAGNOSIS — G6181 Chronic inflammatory demyelinating polyneuritis: Secondary | ICD-10-CM | POA: Diagnosis not present

## 2018-07-11 DIAGNOSIS — G894 Chronic pain syndrome: Secondary | ICD-10-CM | POA: Diagnosis not present

## 2018-07-15 DIAGNOSIS — F329 Major depressive disorder, single episode, unspecified: Secondary | ICD-10-CM | POA: Diagnosis not present

## 2018-07-15 DIAGNOSIS — Z79899 Other long term (current) drug therapy: Secondary | ICD-10-CM | POA: Diagnosis not present

## 2018-07-15 DIAGNOSIS — G8929 Other chronic pain: Secondary | ICD-10-CM | POA: Diagnosis not present

## 2018-07-15 DIAGNOSIS — M549 Dorsalgia, unspecified: Secondary | ICD-10-CM | POA: Diagnosis not present

## 2018-07-15 DIAGNOSIS — R410 Disorientation, unspecified: Secondary | ICD-10-CM | POA: Diagnosis not present

## 2018-07-15 DIAGNOSIS — I10 Essential (primary) hypertension: Secondary | ICD-10-CM | POA: Diagnosis not present

## 2018-07-15 DIAGNOSIS — F339 Major depressive disorder, recurrent, unspecified: Secondary | ICD-10-CM | POA: Diagnosis not present

## 2018-07-15 DIAGNOSIS — J449 Chronic obstructive pulmonary disease, unspecified: Secondary | ICD-10-CM | POA: Diagnosis not present

## 2018-07-15 DIAGNOSIS — Z66 Do not resuscitate: Secondary | ICD-10-CM | POA: Diagnosis present

## 2018-07-15 DIAGNOSIS — K219 Gastro-esophageal reflux disease without esophagitis: Secondary | ICD-10-CM | POA: Diagnosis present

## 2018-07-15 DIAGNOSIS — Z87891 Personal history of nicotine dependence: Secondary | ICD-10-CM | POA: Diagnosis not present

## 2018-07-15 DIAGNOSIS — Z993 Dependence on wheelchair: Secondary | ICD-10-CM | POA: Diagnosis not present

## 2018-07-15 DIAGNOSIS — G459 Transient cerebral ischemic attack, unspecified: Secondary | ICD-10-CM | POA: Diagnosis not present

## 2018-07-15 DIAGNOSIS — D649 Anemia, unspecified: Secondary | ICD-10-CM | POA: Diagnosis not present

## 2018-07-15 DIAGNOSIS — Z9181 History of falling: Secondary | ICD-10-CM | POA: Diagnosis not present

## 2018-07-15 DIAGNOSIS — I82509 Chronic embolism and thrombosis of unspecified deep veins of unspecified lower extremity: Secondary | ICD-10-CM | POA: Diagnosis not present

## 2018-07-15 DIAGNOSIS — Z955 Presence of coronary angioplasty implant and graft: Secondary | ICD-10-CM | POA: Diagnosis not present

## 2018-07-15 DIAGNOSIS — M545 Low back pain: Secondary | ICD-10-CM | POA: Diagnosis not present

## 2018-07-15 DIAGNOSIS — E785 Hyperlipidemia, unspecified: Secondary | ICD-10-CM | POA: Diagnosis not present

## 2018-07-15 DIAGNOSIS — Z7901 Long term (current) use of anticoagulants: Secondary | ICD-10-CM | POA: Diagnosis not present

## 2018-07-15 DIAGNOSIS — W1839XA Other fall on same level, initial encounter: Secondary | ICD-10-CM | POA: Diagnosis not present

## 2018-07-15 DIAGNOSIS — J209 Acute bronchitis, unspecified: Secondary | ICD-10-CM | POA: Diagnosis not present

## 2018-07-15 DIAGNOSIS — R300 Dysuria: Secondary | ICD-10-CM | POA: Diagnosis not present

## 2018-07-15 DIAGNOSIS — Z86718 Personal history of other venous thrombosis and embolism: Secondary | ICD-10-CM | POA: Diagnosis not present

## 2018-07-15 DIAGNOSIS — Z96643 Presence of artificial hip joint, bilateral: Secondary | ICD-10-CM | POA: Diagnosis present

## 2018-07-15 DIAGNOSIS — R55 Syncope and collapse: Secondary | ICD-10-CM | POA: Diagnosis not present

## 2018-07-15 DIAGNOSIS — G40909 Epilepsy, unspecified, not intractable, without status epilepticus: Secondary | ICD-10-CM | POA: Diagnosis not present

## 2018-07-15 DIAGNOSIS — Z9981 Dependence on supplemental oxygen: Secondary | ICD-10-CM | POA: Diagnosis not present

## 2018-07-15 DIAGNOSIS — Z90710 Acquired absence of both cervix and uterus: Secondary | ICD-10-CM | POA: Diagnosis not present

## 2018-07-15 DIAGNOSIS — Y998 Other external cause status: Secondary | ICD-10-CM | POA: Diagnosis not present

## 2018-07-15 DIAGNOSIS — R5381 Other malaise: Secondary | ICD-10-CM | POA: Diagnosis not present

## 2018-07-15 DIAGNOSIS — J9622 Acute and chronic respiratory failure with hypercapnia: Secondary | ICD-10-CM | POA: Diagnosis not present

## 2018-07-15 DIAGNOSIS — E872 Acidosis: Secondary | ICD-10-CM | POA: Diagnosis present

## 2018-07-15 DIAGNOSIS — W19XXXD Unspecified fall, subsequent encounter: Secondary | ICD-10-CM | POA: Diagnosis not present

## 2018-07-15 DIAGNOSIS — N390 Urinary tract infection, site not specified: Secondary | ICD-10-CM | POA: Diagnosis not present

## 2018-07-15 DIAGNOSIS — J984 Other disorders of lung: Secondary | ICD-10-CM | POA: Diagnosis not present

## 2018-07-15 DIAGNOSIS — F0390 Unspecified dementia without behavioral disturbance: Secondary | ICD-10-CM | POA: Diagnosis not present

## 2018-07-15 DIAGNOSIS — Z7409 Other reduced mobility: Secondary | ICD-10-CM | POA: Diagnosis not present

## 2018-07-15 DIAGNOSIS — I2510 Atherosclerotic heart disease of native coronary artery without angina pectoris: Secondary | ICD-10-CM | POA: Diagnosis not present

## 2018-07-15 DIAGNOSIS — R569 Unspecified convulsions: Secondary | ICD-10-CM | POA: Diagnosis not present

## 2018-07-15 DIAGNOSIS — J9621 Acute and chronic respiratory failure with hypoxia: Secondary | ICD-10-CM | POA: Diagnosis not present

## 2018-07-15 DIAGNOSIS — C3411 Malignant neoplasm of upper lobe, right bronchus or lung: Secondary | ICD-10-CM | POA: Diagnosis not present

## 2018-07-15 DIAGNOSIS — E039 Hypothyroidism, unspecified: Secondary | ICD-10-CM | POA: Diagnosis present

## 2018-07-15 DIAGNOSIS — Z85118 Personal history of other malignant neoplasm of bronchus and lung: Secondary | ICD-10-CM | POA: Diagnosis not present

## 2018-07-18 DIAGNOSIS — R569 Unspecified convulsions: Secondary | ICD-10-CM | POA: Diagnosis not present

## 2018-07-18 DIAGNOSIS — J9621 Acute and chronic respiratory failure with hypoxia: Secondary | ICD-10-CM | POA: Diagnosis not present

## 2018-07-18 DIAGNOSIS — J9622 Acute and chronic respiratory failure with hypercapnia: Secondary | ICD-10-CM | POA: Diagnosis not present

## 2018-07-18 DIAGNOSIS — W19XXXD Unspecified fall, subsequent encounter: Secondary | ICD-10-CM | POA: Diagnosis not present

## 2018-07-18 DIAGNOSIS — D649 Anemia, unspecified: Secondary | ICD-10-CM | POA: Diagnosis not present

## 2018-07-18 DIAGNOSIS — F339 Major depressive disorder, recurrent, unspecified: Secondary | ICD-10-CM | POA: Diagnosis not present

## 2018-07-18 DIAGNOSIS — F329 Major depressive disorder, single episode, unspecified: Secondary | ICD-10-CM | POA: Diagnosis not present

## 2018-07-18 DIAGNOSIS — N390 Urinary tract infection, site not specified: Secondary | ICD-10-CM | POA: Diagnosis not present

## 2018-07-18 DIAGNOSIS — I82509 Chronic embolism and thrombosis of unspecified deep veins of unspecified lower extremity: Secondary | ICD-10-CM | POA: Diagnosis not present

## 2018-07-18 DIAGNOSIS — M549 Dorsalgia, unspecified: Secondary | ICD-10-CM | POA: Diagnosis not present

## 2018-07-18 DIAGNOSIS — E785 Hyperlipidemia, unspecified: Secondary | ICD-10-CM | POA: Diagnosis not present

## 2018-07-18 DIAGNOSIS — I10 Essential (primary) hypertension: Secondary | ICD-10-CM | POA: Diagnosis not present

## 2018-07-18 DIAGNOSIS — M545 Low back pain: Secondary | ICD-10-CM | POA: Diagnosis not present

## 2018-07-18 DIAGNOSIS — Z7901 Long term (current) use of anticoagulants: Secondary | ICD-10-CM | POA: Diagnosis not present

## 2018-07-18 DIAGNOSIS — Z85118 Personal history of other malignant neoplasm of bronchus and lung: Secondary | ICD-10-CM | POA: Diagnosis not present

## 2018-07-18 DIAGNOSIS — F0390 Unspecified dementia without behavioral disturbance: Secondary | ICD-10-CM | POA: Diagnosis not present

## 2018-07-18 DIAGNOSIS — R5381 Other malaise: Secondary | ICD-10-CM | POA: Diagnosis not present

## 2018-07-27 DIAGNOSIS — M961 Postlaminectomy syndrome, not elsewhere classified: Secondary | ICD-10-CM | POA: Diagnosis not present

## 2018-07-27 DIAGNOSIS — I10 Essential (primary) hypertension: Secondary | ICD-10-CM | POA: Diagnosis not present

## 2018-07-27 DIAGNOSIS — Z9181 History of falling: Secondary | ICD-10-CM | POA: Diagnosis not present

## 2018-07-27 DIAGNOSIS — R5381 Other malaise: Secondary | ICD-10-CM | POA: Diagnosis not present

## 2018-07-28 DIAGNOSIS — Z86718 Personal history of other venous thrombosis and embolism: Secondary | ICD-10-CM | POA: Diagnosis not present

## 2018-07-28 DIAGNOSIS — W19XXXD Unspecified fall, subsequent encounter: Secondary | ICD-10-CM | POA: Diagnosis not present

## 2018-07-28 DIAGNOSIS — J9621 Acute and chronic respiratory failure with hypoxia: Secondary | ICD-10-CM | POA: Diagnosis not present

## 2018-07-28 DIAGNOSIS — Z9981 Dependence on supplemental oxygen: Secondary | ICD-10-CM | POA: Diagnosis not present

## 2018-07-28 DIAGNOSIS — J9622 Acute and chronic respiratory failure with hypercapnia: Secondary | ICD-10-CM | POA: Diagnosis not present

## 2018-07-28 DIAGNOSIS — J449 Chronic obstructive pulmonary disease, unspecified: Secondary | ICD-10-CM | POA: Diagnosis not present

## 2018-07-28 DIAGNOSIS — Z85118 Personal history of other malignant neoplasm of bronchus and lung: Secondary | ICD-10-CM | POA: Diagnosis not present

## 2018-07-31 DIAGNOSIS — J9622 Acute and chronic respiratory failure with hypercapnia: Secondary | ICD-10-CM | POA: Diagnosis not present

## 2018-07-31 DIAGNOSIS — Z85118 Personal history of other malignant neoplasm of bronchus and lung: Secondary | ICD-10-CM | POA: Diagnosis not present

## 2018-07-31 DIAGNOSIS — J9621 Acute and chronic respiratory failure with hypoxia: Secondary | ICD-10-CM | POA: Diagnosis not present

## 2018-07-31 DIAGNOSIS — W19XXXD Unspecified fall, subsequent encounter: Secondary | ICD-10-CM | POA: Diagnosis not present

## 2018-07-31 DIAGNOSIS — J449 Chronic obstructive pulmonary disease, unspecified: Secondary | ICD-10-CM | POA: Diagnosis not present

## 2018-07-31 DIAGNOSIS — Z9981 Dependence on supplemental oxygen: Secondary | ICD-10-CM | POA: Diagnosis not present

## 2018-08-01 DIAGNOSIS — W19XXXD Unspecified fall, subsequent encounter: Secondary | ICD-10-CM | POA: Diagnosis not present

## 2018-08-01 DIAGNOSIS — J449 Chronic obstructive pulmonary disease, unspecified: Secondary | ICD-10-CM | POA: Diagnosis not present

## 2018-08-01 DIAGNOSIS — Z9981 Dependence on supplemental oxygen: Secondary | ICD-10-CM | POA: Diagnosis not present

## 2018-08-01 DIAGNOSIS — J9621 Acute and chronic respiratory failure with hypoxia: Secondary | ICD-10-CM | POA: Diagnosis not present

## 2018-08-01 DIAGNOSIS — J9622 Acute and chronic respiratory failure with hypercapnia: Secondary | ICD-10-CM | POA: Diagnosis not present

## 2018-08-01 DIAGNOSIS — Z85118 Personal history of other malignant neoplasm of bronchus and lung: Secondary | ICD-10-CM | POA: Diagnosis not present

## 2018-08-02 DIAGNOSIS — J449 Chronic obstructive pulmonary disease, unspecified: Secondary | ICD-10-CM | POA: Diagnosis not present

## 2018-08-02 DIAGNOSIS — Z85118 Personal history of other malignant neoplasm of bronchus and lung: Secondary | ICD-10-CM | POA: Diagnosis not present

## 2018-08-02 DIAGNOSIS — J9621 Acute and chronic respiratory failure with hypoxia: Secondary | ICD-10-CM | POA: Diagnosis not present

## 2018-08-02 DIAGNOSIS — Z9981 Dependence on supplemental oxygen: Secondary | ICD-10-CM | POA: Diagnosis not present

## 2018-08-02 DIAGNOSIS — J9622 Acute and chronic respiratory failure with hypercapnia: Secondary | ICD-10-CM | POA: Diagnosis not present

## 2018-08-02 DIAGNOSIS — W19XXXD Unspecified fall, subsequent encounter: Secondary | ICD-10-CM | POA: Diagnosis not present

## 2018-08-03 DIAGNOSIS — Z85118 Personal history of other malignant neoplasm of bronchus and lung: Secondary | ICD-10-CM | POA: Diagnosis not present

## 2018-08-03 DIAGNOSIS — J9622 Acute and chronic respiratory failure with hypercapnia: Secondary | ICD-10-CM | POA: Diagnosis not present

## 2018-08-03 DIAGNOSIS — W19XXXD Unspecified fall, subsequent encounter: Secondary | ICD-10-CM | POA: Diagnosis not present

## 2018-08-03 DIAGNOSIS — Z9981 Dependence on supplemental oxygen: Secondary | ICD-10-CM | POA: Diagnosis not present

## 2018-08-03 DIAGNOSIS — J449 Chronic obstructive pulmonary disease, unspecified: Secondary | ICD-10-CM | POA: Diagnosis not present

## 2018-08-03 DIAGNOSIS — J9621 Acute and chronic respiratory failure with hypoxia: Secondary | ICD-10-CM | POA: Diagnosis not present

## 2018-08-07 DIAGNOSIS — J9621 Acute and chronic respiratory failure with hypoxia: Secondary | ICD-10-CM | POA: Diagnosis not present

## 2018-08-07 DIAGNOSIS — W19XXXD Unspecified fall, subsequent encounter: Secondary | ICD-10-CM | POA: Diagnosis not present

## 2018-08-07 DIAGNOSIS — Z9981 Dependence on supplemental oxygen: Secondary | ICD-10-CM | POA: Diagnosis not present

## 2018-08-07 DIAGNOSIS — J9622 Acute and chronic respiratory failure with hypercapnia: Secondary | ICD-10-CM | POA: Diagnosis not present

## 2018-08-07 DIAGNOSIS — Z85118 Personal history of other malignant neoplasm of bronchus and lung: Secondary | ICD-10-CM | POA: Diagnosis not present

## 2018-08-07 DIAGNOSIS — J449 Chronic obstructive pulmonary disease, unspecified: Secondary | ICD-10-CM | POA: Diagnosis not present

## 2018-08-08 DIAGNOSIS — J449 Chronic obstructive pulmonary disease, unspecified: Secondary | ICD-10-CM | POA: Diagnosis not present

## 2018-08-08 DIAGNOSIS — J9622 Acute and chronic respiratory failure with hypercapnia: Secondary | ICD-10-CM | POA: Diagnosis not present

## 2018-08-08 DIAGNOSIS — J9621 Acute and chronic respiratory failure with hypoxia: Secondary | ICD-10-CM | POA: Diagnosis not present

## 2018-08-08 DIAGNOSIS — Z9981 Dependence on supplemental oxygen: Secondary | ICD-10-CM | POA: Diagnosis not present

## 2018-08-08 DIAGNOSIS — Z85118 Personal history of other malignant neoplasm of bronchus and lung: Secondary | ICD-10-CM | POA: Diagnosis not present

## 2018-08-08 DIAGNOSIS — W19XXXD Unspecified fall, subsequent encounter: Secondary | ICD-10-CM | POA: Diagnosis not present

## 2018-08-10 DIAGNOSIS — W19XXXD Unspecified fall, subsequent encounter: Secondary | ICD-10-CM | POA: Diagnosis not present

## 2018-08-10 DIAGNOSIS — J9622 Acute and chronic respiratory failure with hypercapnia: Secondary | ICD-10-CM | POA: Diagnosis not present

## 2018-08-10 DIAGNOSIS — Z9981 Dependence on supplemental oxygen: Secondary | ICD-10-CM | POA: Diagnosis not present

## 2018-08-10 DIAGNOSIS — J9621 Acute and chronic respiratory failure with hypoxia: Secondary | ICD-10-CM | POA: Diagnosis not present

## 2018-08-10 DIAGNOSIS — J449 Chronic obstructive pulmonary disease, unspecified: Secondary | ICD-10-CM | POA: Diagnosis not present

## 2018-08-10 DIAGNOSIS — Z85118 Personal history of other malignant neoplasm of bronchus and lung: Secondary | ICD-10-CM | POA: Diagnosis not present

## 2018-08-14 DIAGNOSIS — J449 Chronic obstructive pulmonary disease, unspecified: Secondary | ICD-10-CM | POA: Diagnosis not present

## 2018-08-14 DIAGNOSIS — J9621 Acute and chronic respiratory failure with hypoxia: Secondary | ICD-10-CM | POA: Diagnosis not present

## 2018-08-14 DIAGNOSIS — W19XXXD Unspecified fall, subsequent encounter: Secondary | ICD-10-CM | POA: Diagnosis not present

## 2018-08-14 DIAGNOSIS — Z85118 Personal history of other malignant neoplasm of bronchus and lung: Secondary | ICD-10-CM | POA: Diagnosis not present

## 2018-08-14 DIAGNOSIS — Z9981 Dependence on supplemental oxygen: Secondary | ICD-10-CM | POA: Diagnosis not present

## 2018-08-14 DIAGNOSIS — J9622 Acute and chronic respiratory failure with hypercapnia: Secondary | ICD-10-CM | POA: Diagnosis not present

## 2018-08-18 DIAGNOSIS — Z9981 Dependence on supplemental oxygen: Secondary | ICD-10-CM | POA: Diagnosis not present

## 2018-08-18 DIAGNOSIS — Z85118 Personal history of other malignant neoplasm of bronchus and lung: Secondary | ICD-10-CM | POA: Diagnosis not present

## 2018-08-18 DIAGNOSIS — J9622 Acute and chronic respiratory failure with hypercapnia: Secondary | ICD-10-CM | POA: Diagnosis not present

## 2018-08-18 DIAGNOSIS — J449 Chronic obstructive pulmonary disease, unspecified: Secondary | ICD-10-CM | POA: Diagnosis not present

## 2018-08-18 DIAGNOSIS — W19XXXD Unspecified fall, subsequent encounter: Secondary | ICD-10-CM | POA: Diagnosis not present

## 2018-08-18 DIAGNOSIS — J9621 Acute and chronic respiratory failure with hypoxia: Secondary | ICD-10-CM | POA: Diagnosis not present

## 2018-08-23 DIAGNOSIS — Z85118 Personal history of other malignant neoplasm of bronchus and lung: Secondary | ICD-10-CM | POA: Diagnosis not present

## 2018-08-23 DIAGNOSIS — J9622 Acute and chronic respiratory failure with hypercapnia: Secondary | ICD-10-CM | POA: Diagnosis not present

## 2018-08-23 DIAGNOSIS — W19XXXD Unspecified fall, subsequent encounter: Secondary | ICD-10-CM | POA: Diagnosis not present

## 2018-08-23 DIAGNOSIS — Z9981 Dependence on supplemental oxygen: Secondary | ICD-10-CM | POA: Diagnosis not present

## 2018-08-23 DIAGNOSIS — J449 Chronic obstructive pulmonary disease, unspecified: Secondary | ICD-10-CM | POA: Diagnosis not present

## 2018-08-23 DIAGNOSIS — J9621 Acute and chronic respiratory failure with hypoxia: Secondary | ICD-10-CM | POA: Diagnosis not present

## 2018-08-24 DIAGNOSIS — W19XXXD Unspecified fall, subsequent encounter: Secondary | ICD-10-CM | POA: Diagnosis not present

## 2018-08-24 DIAGNOSIS — Z9981 Dependence on supplemental oxygen: Secondary | ICD-10-CM | POA: Diagnosis not present

## 2018-08-24 DIAGNOSIS — Z85118 Personal history of other malignant neoplasm of bronchus and lung: Secondary | ICD-10-CM | POA: Diagnosis not present

## 2018-08-24 DIAGNOSIS — J9621 Acute and chronic respiratory failure with hypoxia: Secondary | ICD-10-CM | POA: Diagnosis not present

## 2018-08-24 DIAGNOSIS — J449 Chronic obstructive pulmonary disease, unspecified: Secondary | ICD-10-CM | POA: Diagnosis not present

## 2018-08-24 DIAGNOSIS — J9622 Acute and chronic respiratory failure with hypercapnia: Secondary | ICD-10-CM | POA: Diagnosis not present

## 2018-08-27 DIAGNOSIS — Z86718 Personal history of other venous thrombosis and embolism: Secondary | ICD-10-CM | POA: Diagnosis not present

## 2018-08-27 DIAGNOSIS — Z85118 Personal history of other malignant neoplasm of bronchus and lung: Secondary | ICD-10-CM | POA: Diagnosis not present

## 2018-08-27 DIAGNOSIS — Z9981 Dependence on supplemental oxygen: Secondary | ICD-10-CM | POA: Diagnosis not present

## 2018-08-27 DIAGNOSIS — J449 Chronic obstructive pulmonary disease, unspecified: Secondary | ICD-10-CM | POA: Diagnosis not present

## 2018-08-27 DIAGNOSIS — J9621 Acute and chronic respiratory failure with hypoxia: Secondary | ICD-10-CM | POA: Diagnosis not present

## 2018-08-27 DIAGNOSIS — J9622 Acute and chronic respiratory failure with hypercapnia: Secondary | ICD-10-CM | POA: Diagnosis not present

## 2018-08-27 DIAGNOSIS — W19XXXD Unspecified fall, subsequent encounter: Secondary | ICD-10-CM | POA: Diagnosis not present

## 2018-08-29 DIAGNOSIS — Z9981 Dependence on supplemental oxygen: Secondary | ICD-10-CM | POA: Diagnosis not present

## 2018-08-29 DIAGNOSIS — Z85118 Personal history of other malignant neoplasm of bronchus and lung: Secondary | ICD-10-CM | POA: Diagnosis not present

## 2018-08-29 DIAGNOSIS — J9622 Acute and chronic respiratory failure with hypercapnia: Secondary | ICD-10-CM | POA: Diagnosis not present

## 2018-08-29 DIAGNOSIS — W19XXXD Unspecified fall, subsequent encounter: Secondary | ICD-10-CM | POA: Diagnosis not present

## 2018-08-29 DIAGNOSIS — J449 Chronic obstructive pulmonary disease, unspecified: Secondary | ICD-10-CM | POA: Diagnosis not present

## 2018-08-29 DIAGNOSIS — J9621 Acute and chronic respiratory failure with hypoxia: Secondary | ICD-10-CM | POA: Diagnosis not present

## 2018-08-30 NOTE — Progress Notes (Signed)
Cardiology Office Note:    Date:  08/31/2018   ID:  Cheyenne [name], DOB [birth year], MRN [id number]  PCP:  Myer Peer, MD  Cardiologist:  Shirlee More, MD    Referring MD: Myer Peer, MD    ASSESSMENT:    1. Hypertensive heart disease without heart failure   2. Coronary artery calcification seen on CAT scan    PLAN:    In order of problems listed above:  1. Stable blood pressure target her husband follows closely at home and he gives her a dose ARB if systolics are greater than 150 to avoid symptomatic hypotension. 2. Stable she has had a myocardial perfusion study without ischemia has not had chest pain recently her EKG is stable at this time I do not think that requires an ischemia evaluation   Next appointment: 6 months   Medication Adjustments/Labs and Tests Ordered: Current medicines are reviewed at length with the patient today.  Concerns regarding medicines are outlined above.  Orders Placed This Encounter  Procedures  . EKG 12-Lead   Meds ordered this encounter  Medications  . rivaroxaban (XARELTO) 10 MG TABS tablet    Sig: Take 1 tablet (10 mg total) by mouth daily.    Dispense:  30 tablet    Refill:  5    Chief Complaint  Patient presents with  . Follow-up      CAC   . Hypertension  . Hyperlipidemia  . Anticoagulation    History of Present Illness:    Cheyenne [name] is a 78 y.o. female with a hx of COPD with chronic hypoxic respiratory failure, venous thromboembolism, CAC-coronary atherosclerosis, Hyperlipidemia, Hypertension, Peripheral Vascular Disease, S/P PCI, and right lung cancer Stage IA (T1a, N0, M0) treated with XRT on long term anticoagulation   last seen 08/31/17. She had a normal MPI at [facility] in January 2018 for evaluation of chest pain. Compliance with diet, lifestyle and medications: 08/31/17 Past Medical History:  Diagnosis Date  . Acute deep vein thrombosis (DVT) of femoral vein of left lower extremity ([place]) 07/08/2015  . Acute  embolism and thrombosis of unspecified deep veins of left proximal lower extremity ([place]) 08/12/2015  . Acute pulmonary embolism ([place]) 07/08/2015  . Adult hypothyroidism 12/21/2011  . Anemia 08/12/2015  . 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 Arteriosclerosis of coronary artery 12/21/2011   Overview:  2 vessel s/p PCI and Vision stent to ostial RCA and PTCA of DI (02/2009).  Lexiscan MPS (10/20/11) revealed normal perfusion and function, EF 66%.  Last Assessment & Plan:  Relevant Hx: Course: Daily Update: Today's Plan:   . At risk for falls 03/05/2014  . Atherosclerotic cerebrovascular disease 04/10/2012  . Back pain, chronic 04/10/2012  . Bladder infection, chronic 05/29/2014  . BP (high blood pressure) 05/24/2011  . Carotid atherosclerosis, bilateral 07/24/2016  . 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 Chronic kidney disease (CKD), stage III (moderate) ([place]) 05/24/2011  . Chronic pain associated with significant psychosocial dysfunction 11/09/2012  . Chronic pain syndrome 11/09/2012  . Chronic respiratory failure ([place]) 01/08/2014  . Coccyalgia 02/08/2013  . 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 COPD with emphysema Gold C 01/08/2014   Arlyce Harman 01/08/2014: FeV1 52% FVC 68% FeV1/FVC 57%    CT Chest 11/2013: emphysematous changes 06/17/2014 CT Chest: no nodule seen, emphysema only    . DDD (degenerative disc disease), lumbar 05/07/2014  . DNR (do not resuscitate) 07/14/2015  .  Failure to thrive (0-17) 08/12/2015  . Gastro-esophageal reflux disease without esophagitis 05/07/2014  . H/O deep venous thrombosis 04/10/2012   Overview:  postoperative Overview:  postoperative   . High risk medication use 03/05/2014  . Hyperlipidemia   . Hypertensive heart disease   . Hypoxemia 11/17/2015  . Iron deficiency 09/14/2011  . Kidney cysts 04/20/2013  . Lower urinary tract infectious  disease 04/10/2012  . Major depressive disorder 08/12/2015  . Malignant neoplasm of upper lobe of right lung ([place]) 09/05/2015  . Multi-vessel coronary artery stenosis   . Nerve root pain 05/07/2014  . 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 Osteoarthritis of spine with radiculopathy, lumbar region 05/07/2014  . Osteoporosis 12/30/2012  . Personal history of other venous thrombosis and embolism 04/10/2012   Overview:  Overview:  Overview:  postoperative Overview:  postoperative Overview:  postoperative  . PVD (peripheral vascular disease) ([place])   . Recurrent UTI 05/23/2014  . S/P lumbar spine operation 02/03/2012  . Seizure disorder ([place])   . SIRS (systemic inflammatory response syndrome) ([place]) 07/07/2015  . Spondylosis, lumbosacral   . Stricture of artery [facility])     Past Surgical History:  Procedure Laterality Date  . APPENDECTOMY    . BACK SURGERY    . JOINT REPLACEMENT    . SPINAL CORD STIMULATOR IMPLANT  2010  . stent  1990  . VESICOVAGINAL FISTULA CLOSURE W/ TAH  1962    Current Medications: Current Meds  Medication Sig  . acetaminophen (TYLENOL) 325 MG tablet Take 650 mg by mouth every 6 (six) hours as needed.  Marland Kitchen albuterol (PROVENTIL) (2.5 MG/3ML) 0.083% nebulizer solution Take 2.5 mg by nebulization every 4 (four) hours as needed for [name].   Marland Kitchen amitriptyline (ELAVIL) 50 MG tablet Take 50 mg by mouth at bedtime.  . bisacodyl (BISCOLAX) 10 MG suppository Place 5 mg rectally daily.  . buprenorphine (BUTRANS) 20 MCG/HR PTWK patch Place onto the skin once a week.  . Cholecalciferol 1000 UNITS capsule Take 1,000 Units by mouth daily.  . DULoxetine (CYMBALTA) 30 MG capsule Take 30 mg by mouth 2 (two) times daily. Take 1 capsule three times  A day.  . folic acid (FOLVITE) 1 MG tablet Take 1 mg by mouth daily.  Marland Kitchen gabapentin (NEURONTIN) 400 MG capsule Take 400 mg by mouth 2 (two) times daily.  Marland Kitchen levETIRAcetam (KEPPRA) 1000 MG tablet TAKE 1 TABLET BID  .  levothyroxine (SYNTHROID, LEVOTHROID) 50 MCG tablet Take 50 mcg by mouth daily before breakfast.  . loperamide (IMODIUM) 2 MG capsule Take 2 mg by mouth as needed for diarrhea or loose stools.  Marland Kitchen losartan (COZAAR) 50 MG tablet Take 0.5 tablets (25 mg total) by mouth daily as needed for up to 30 doses (for systolic > 240 mm Hg).  . Multiple Vitamins-Minerals ([employer]S SPECTRAVITE ADULT 50+ PO) Take 1 tablet by mouth daily.  . nitroGLYCERIN (NITROSTAT) 0.4 MG SL tablet Place 0.4 mg under the tongue every 5 (five) minutes as needed for chest pain.  Marland Kitchen omeprazole (PRILOSEC) 40 MG capsule Take 40 mg by mouth 2 (two) times daily.  . ondansetron (ZOFRAN) 4 MG tablet Take 8 mg by mouth every 8 (eight) hours as needed for vomiting.   . OXYGEN Inhale into the lungs. 2 lpm  . simvastatin (ZOCOR) 20 MG tablet Take 1 tablet (20 mg total) by mouth daily.  Marland Kitchen thiamine (VITAMIN B-1) 100 MG tablet Take 100 mg by mouth 2 (two) times daily.  . Tiotropium  Bromide Monohydrate (SPIRIVA RESPIMAT) 2.5 MCG/ACT AERS Inhale 2.5 mcg into the lungs as needed.  . vitamin C (ASCORBIC ACID) 500 MG tablet Take 250 mg by mouth daily.  . [DISCONTINUED] rivaroxaban (XARELTO) 20 MG TABS tablet Take 20 mg by mouth daily.      Allergies:   Asa [aspirin]; Bee venom; Duloxetine hcl; Hornet venom; Phenergan [promethazine hcl]; Penicillins; and Promethazine   Social History   Socioeconomic History  . Marital status: Single    Spouse name: Not on file  . Number of children: Not on file  . Years of education: Not on file  . Highest education level: Not on file  Occupational History  . Occupation: Retired    Comment: [employer]  Social Needs  . Financial resource strain: Not on file  . Food insecurity:    Worry: Not on file    Inability: Not on file  . Transportation needs:    Medical: Not on file    Non-medical: Not on file  Tobacco Use  . Smoking status: Former Smoker    Packs/day: 1.50    Years: 12.00    Pack years: 18.00     Types: Cigarettes    Last attempt to quit: 07/27/1995    Years since quitting: 23.1  . Smokeless tobacco: Never Used  Substance and Sexual Activity  . Alcohol use: No    Frequency: Never  . Drug use: No  . Sexual activity: Not on file  Lifestyle  . Physical activity:    Days per week: Not on file    Minutes per session: Not on file  . Stress: Not on file  Relationships  . Social connections:    Talks on phone: Not on file    Gets together: Not on file    Attends religious service: Not on file    Active member of club or organization: Not on file    Attends meetings of clubs or organizations: Not on file    Relationship status: Not on file  Other Topics Concern  . Not on file  Social History Narrative  . Not on file     Family History: The patient's family history includes Heart disease in her brother, father, and mother. ROS:   Please see the history of present illness.    All other systems reviewed and are negative.  EKGs/Labs/Other Studies Reviewed:    The following studies were reviewed today:  EKG:  EKG ordered today.  The ekg ordered today demonstrates sinus rhythm T inversion unchanged  Recent Labs:   07/17/18 BMP normal K 4.0 Cr 0.77 Hgb 9.9 No results found for requested labs within last 8760 hours.  Recent Lipid Panel No results found for: CHOL, TRIG, HDL, CHOLHDL, VLDL, LDLCALC, LDLDIRECT  Physical Exam:    VS:  BP 124/64 (BP Location: Left Arm, Patient Position: Sitting, Cuff Size: Normal)   Ht 5\' 5"  (1.651 m)   Wt 169 lb 6 oz (76.8 kg)   SpO2 (!) 88%   BMI 28.19 kg/m     Wt Readings from Last 3 Encounters:  08/31/18 169 lb 6 oz (76.8 kg)  08/31/17 165 lb 1.9 oz (74.9 kg)  04/01/15 176 lb 6.4 oz (80 kg)     GEN:  Well nourished, well developed in no acute distress HEENT: Normal NECK: No JVD; No carotid bruits LYMPHATICS: No lymphadenopathy CARDIAC: RRR, no murmurs, rubs, gallops RESPIRATORY:  Clear to auscultation without rales, wheezing or  rhonchi  ABDOMEN: Soft, non-tender, non-distended MUSCULOSKELETAL:  No edema; No deformity  SKIN: Warm and dry NEUROLOGIC:  Alert and oriented x 3 PSYCHIATRIC:  Normal affect    Signed, Shirlee More, MD  08/31/2018 3:41 PM    [facility]

## 2018-08-31 ENCOUNTER — Ambulatory Visit (INDEPENDENT_AMBULATORY_CARE_PROVIDER_SITE_OTHER): Payer: Medicare Other | Admitting: Cardiology

## 2018-08-31 ENCOUNTER — Encounter: Payer: Self-pay | Admitting: Cardiology

## 2018-08-31 VITALS — BP 124/64 | Ht 65.0 in | Wt 169.4 lb

## 2018-08-31 DIAGNOSIS — I119 Hypertensive heart disease without heart failure: Secondary | ICD-10-CM

## 2018-08-31 DIAGNOSIS — I2510 Atherosclerotic heart disease of native coronary artery without angina pectoris: Secondary | ICD-10-CM | POA: Diagnosis not present

## 2018-08-31 MED ORDER — RIVAROXABAN 10 MG PO TABS: 10.0000 mg | ORAL_TABLET | Freq: Every day | ORAL | 5 refills | Status: DC

## 2018-08-31 NOTE — Patient Instructions (Signed)
Medication Instructions:  Your physician has recommended you make the following change in your medication:   DECREASE: Xarelto to 10mg  once daily  If you need a refill on your cardiac medications before your next appointment, please call your pharmacy.   Lab work: None  If you have labs (blood work) drawn today and your tests are completely normal, you will receive your results only by: Marland Kitchen MyChart Message (if you have MyChart) OR . A paper copy in the mail If you have any lab test that is abnormal or we need to change your treatment, we will call you to review the results.  Testing/Procedures: You had an EKG today.  Follow-Up: At [facility], you and your health needs are our priority.  As part of our continuing mission to provide you with exceptional heart care, we have created designated Provider Care Teams.  These Care Teams include your primary Cardiologist (physician) and Advanced Practice Providers (APPs -  Physician Assistants and Nurse Practitioners) who all work together to provide you with the care you need, when you need it. You will need a follow up appointment in 6 months.  Please call our office 2 months in advance to schedule this appointment.

## 2018-09-01 DIAGNOSIS — W19XXXD Unspecified fall, subsequent encounter: Secondary | ICD-10-CM | POA: Diagnosis not present

## 2018-09-01 DIAGNOSIS — Z9981 Dependence on supplemental oxygen: Secondary | ICD-10-CM | POA: Diagnosis not present

## 2018-09-01 DIAGNOSIS — J449 Chronic obstructive pulmonary disease, unspecified: Secondary | ICD-10-CM | POA: Diagnosis not present

## 2018-09-01 DIAGNOSIS — Z85118 Personal history of other malignant neoplasm of bronchus and lung: Secondary | ICD-10-CM | POA: Diagnosis not present

## 2018-09-01 DIAGNOSIS — J9621 Acute and chronic respiratory failure with hypoxia: Secondary | ICD-10-CM | POA: Diagnosis not present

## 2018-09-01 DIAGNOSIS — J9622 Acute and chronic respiratory failure with hypercapnia: Secondary | ICD-10-CM | POA: Diagnosis not present

## 2018-09-05 DIAGNOSIS — Z9981 Dependence on supplemental oxygen: Secondary | ICD-10-CM | POA: Diagnosis not present

## 2018-09-05 DIAGNOSIS — W19XXXD Unspecified fall, subsequent encounter: Secondary | ICD-10-CM | POA: Diagnosis not present

## 2018-09-05 DIAGNOSIS — J9622 Acute and chronic respiratory failure with hypercapnia: Secondary | ICD-10-CM | POA: Diagnosis not present

## 2018-09-05 DIAGNOSIS — J9621 Acute and chronic respiratory failure with hypoxia: Secondary | ICD-10-CM | POA: Diagnosis not present

## 2018-09-05 DIAGNOSIS — J449 Chronic obstructive pulmonary disease, unspecified: Secondary | ICD-10-CM | POA: Diagnosis not present

## 2018-09-05 DIAGNOSIS — Z85118 Personal history of other malignant neoplasm of bronchus and lung: Secondary | ICD-10-CM | POA: Diagnosis not present

## 2018-09-07 DIAGNOSIS — J449 Chronic obstructive pulmonary disease, unspecified: Secondary | ICD-10-CM | POA: Diagnosis not present

## 2018-09-07 DIAGNOSIS — J9621 Acute and chronic respiratory failure with hypoxia: Secondary | ICD-10-CM | POA: Diagnosis not present

## 2018-09-07 DIAGNOSIS — W19XXXD Unspecified fall, subsequent encounter: Secondary | ICD-10-CM | POA: Diagnosis not present

## 2018-09-07 DIAGNOSIS — Z9981 Dependence on supplemental oxygen: Secondary | ICD-10-CM | POA: Diagnosis not present

## 2018-09-07 DIAGNOSIS — J9622 Acute and chronic respiratory failure with hypercapnia: Secondary | ICD-10-CM | POA: Diagnosis not present

## 2018-09-07 DIAGNOSIS — Z85118 Personal history of other malignant neoplasm of bronchus and lung: Secondary | ICD-10-CM | POA: Diagnosis not present

## 2018-09-11 ENCOUNTER — Telehealth: Payer: Self-pay

## 2018-09-11 DIAGNOSIS — I119 Hypertensive heart disease without heart failure: Secondary | ICD-10-CM

## 2018-09-11 NOTE — Telephone Encounter (Signed)
Akshita Italiano called to report that the patient's systolic blood pressure has been elevated since 09/04/2018. Systolic BP has been generally in the 160's but has been as high as 196. (Her diastolic blood pressure has been in the 70's.) She has been taking losartan when it has been >150 but it does not seem to be lowering it.   Please advise.

## 2018-09-11 NOTE — Telephone Encounter (Signed)
We will plan regular an extra dose of ARB if needed I would follow her directions

## 2018-09-11 NOTE — Telephone Encounter (Signed)
Just to clarify, patient should increase losartan from 25 mg to 50 mg daily as needed if SBP is greater than 150?

## 2018-09-11 NOTE — Telephone Encounter (Signed)
Please advise. Thanks.  

## 2018-09-12 MED ORDER — LOSARTAN POTASSIUM 50 MG PO TABS: 50.0000 mg | ORAL_TABLET | Freq: Every day | ORAL | 0 refills | Status: DC | PRN

## 2018-09-12 NOTE — Telephone Encounter (Signed)
Yes

## 2018-09-12 NOTE — Telephone Encounter (Signed)
Patient's husband, Clair Gulling, per [facility] informed for patient to increase dose of losartan from 25 mg to 50 mg daily as needed if SBP is greater than 150.   Clair Gulling reports that patient is taking her blood pressure every hour. Trinidad Curet that patient should only take her blood pressure twice daily, once in the morning and once in the evening at the same time. Informed him to contact our office if patient's BP remains elevated. Clair Gulling verbalized understanding. No further questions.

## 2018-09-12 NOTE — Addendum Note (Signed)
Addended by: Austin Miles on: 09/12/2018 08:43 AM   Modules accepted: Orders

## 2018-09-14 DIAGNOSIS — Z9981 Dependence on supplemental oxygen: Secondary | ICD-10-CM | POA: Diagnosis not present

## 2018-09-14 DIAGNOSIS — J9622 Acute and chronic respiratory failure with hypercapnia: Secondary | ICD-10-CM | POA: Diagnosis not present

## 2018-09-14 DIAGNOSIS — Z85118 Personal history of other malignant neoplasm of bronchus and lung: Secondary | ICD-10-CM | POA: Diagnosis not present

## 2018-09-14 DIAGNOSIS — J9621 Acute and chronic respiratory failure with hypoxia: Secondary | ICD-10-CM | POA: Diagnosis not present

## 2018-09-14 DIAGNOSIS — J449 Chronic obstructive pulmonary disease, unspecified: Secondary | ICD-10-CM | POA: Diagnosis not present

## 2018-09-14 DIAGNOSIS — W19XXXD Unspecified fall, subsequent encounter: Secondary | ICD-10-CM | POA: Diagnosis not present

## 2018-09-18 DIAGNOSIS — I10 Essential (primary) hypertension: Secondary | ICD-10-CM | POA: Diagnosis not present

## 2018-09-18 DIAGNOSIS — N390 Urinary tract infection, site not specified: Secondary | ICD-10-CM | POA: Diagnosis not present

## 2018-09-23 DIAGNOSIS — I10 Essential (primary) hypertension: Secondary | ICD-10-CM | POA: Diagnosis not present

## 2018-09-23 DIAGNOSIS — E782 Mixed hyperlipidemia: Secondary | ICD-10-CM | POA: Diagnosis not present

## 2018-10-05 DIAGNOSIS — M961 Postlaminectomy syndrome, not elsewhere classified: Secondary | ICD-10-CM | POA: Diagnosis not present

## 2018-10-05 DIAGNOSIS — G894 Chronic pain syndrome: Secondary | ICD-10-CM | POA: Diagnosis not present

## 2018-10-31 DIAGNOSIS — J441 Chronic obstructive pulmonary disease with (acute) exacerbation: Secondary | ICD-10-CM | POA: Diagnosis not present

## 2018-11-03 ENCOUNTER — Telehealth: Payer: Self-pay | Admitting: Cardiology

## 2018-11-03 NOTE — Telephone Encounter (Signed)
Barnetta Chapel, RN spoke with Clair Gulling who states that patient is having CP while breathing.  He O2 is running 92-94%, HR 80's,  BP 130's-140's/60's.  She saw Dr Humphrey Rolls last week about same symptoms and he advised she may have scar tissue from cancer treatments, but he did give abx incase of infection--Zithromax 500mg  daily x 5 days.  Symptoms are not better, and wanted to see if Dr Bettina Gavia had any other suggestions.  Patient had been seeing pulmonologist in [place] and was told to be seen on an as needed basis.  Please advise

## 2018-11-03 NOTE — Telephone Encounter (Signed)
:   I reviewed her chart she has multiple significant comorbidities if unimproved and more short of breath I think it is time for her to be seen in the emergency room and probably the best place for her to be seen by me at [facility].  I think this is likely an exacerbation of her COPD

## 2018-11-03 NOTE — Telephone Encounter (Signed)
Spoke to patient's husband Clair Gulling and advised him that Dr Bettina Gavia does not feel like this is a cardiac issue. Clair Gulling advised that if shortness of breath does not improve she should be taking to nearest ED.  Clair Gulling informed me that he would not take her to ED due to Covid-19.  He was also instructed to follow up with pulmonologist as Dr Bettina Gavia feels this is likely an exacerbation of COPD.  Clair Gulling agreed to plan and verbalized understanding.

## 2018-11-03 NOTE — Telephone Encounter (Signed)
Patient's husband just called stating she is having rt chest pain and SOB. Saw PCP and he put her on antibiotics but she is still short of breath. No temp at this time. Please advise.

## 2018-11-14 DIAGNOSIS — Z85118 Personal history of other malignant neoplasm of bronchus and lung: Secondary | ICD-10-CM | POA: Diagnosis not present

## 2018-11-14 DIAGNOSIS — K219 Gastro-esophageal reflux disease without esophagitis: Secondary | ICD-10-CM | POA: Diagnosis not present

## 2018-11-14 DIAGNOSIS — F419 Anxiety disorder, unspecified: Secondary | ICD-10-CM | POA: Diagnosis not present

## 2018-11-21 DIAGNOSIS — R918 Other nonspecific abnormal finding of lung field: Secondary | ICD-10-CM | POA: Diagnosis not present

## 2018-11-21 DIAGNOSIS — I700 Atherosclerosis of aorta: Secondary | ICD-10-CM | POA: Diagnosis not present

## 2018-11-21 DIAGNOSIS — C3490 Malignant neoplasm of unspecified part of unspecified bronchus or lung: Secondary | ICD-10-CM | POA: Diagnosis not present

## 2018-11-21 DIAGNOSIS — J439 Emphysema, unspecified: Secondary | ICD-10-CM | POA: Diagnosis not present

## 2018-11-21 DIAGNOSIS — J432 Centrilobular emphysema: Secondary | ICD-10-CM | POA: Diagnosis not present

## 2018-11-21 DIAGNOSIS — R590 Localized enlarged lymph nodes: Secondary | ICD-10-CM | POA: Diagnosis not present

## 2018-11-21 DIAGNOSIS — J984 Other disorders of lung: Secondary | ICD-10-CM | POA: Diagnosis not present

## 2018-11-21 DIAGNOSIS — C3411 Malignant neoplasm of upper lobe, right bronchus or lung: Secondary | ICD-10-CM | POA: Diagnosis not present

## 2018-11-28 DIAGNOSIS — R0789 Other chest pain: Secondary | ICD-10-CM | POA: Diagnosis not present

## 2018-11-28 DIAGNOSIS — Z79891 Long term (current) use of opiate analgesic: Secondary | ICD-10-CM | POA: Diagnosis not present

## 2018-11-28 DIAGNOSIS — M961 Postlaminectomy syndrome, not elsewhere classified: Secondary | ICD-10-CM | POA: Diagnosis not present

## 2018-11-28 DIAGNOSIS — G8929 Other chronic pain: Secondary | ICD-10-CM | POA: Diagnosis not present

## 2018-12-25 DIAGNOSIS — J441 Chronic obstructive pulmonary disease with (acute) exacerbation: Secondary | ICD-10-CM | POA: Diagnosis not present

## 2018-12-25 DIAGNOSIS — J9611 Chronic respiratory failure with hypoxia: Secondary | ICD-10-CM | POA: Diagnosis not present

## 2018-12-25 DIAGNOSIS — C3411 Malignant neoplasm of upper lobe, right bronchus or lung: Secondary | ICD-10-CM | POA: Diagnosis not present

## 2018-12-26 DIAGNOSIS — G894 Chronic pain syndrome: Secondary | ICD-10-CM | POA: Diagnosis not present

## 2018-12-26 DIAGNOSIS — M961 Postlaminectomy syndrome, not elsewhere classified: Secondary | ICD-10-CM | POA: Diagnosis not present

## 2018-12-26 DIAGNOSIS — M792 Neuralgia and neuritis, unspecified: Secondary | ICD-10-CM | POA: Diagnosis not present

## 2018-12-26 DIAGNOSIS — R0789 Other chest pain: Secondary | ICD-10-CM | POA: Diagnosis not present

## 2018-12-26 DIAGNOSIS — Z79899 Other long term (current) drug therapy: Secondary | ICD-10-CM | POA: Diagnosis not present

## 2018-12-26 DIAGNOSIS — Z9689 Presence of other specified functional implants: Secondary | ICD-10-CM | POA: Diagnosis not present

## 2019-01-22 DIAGNOSIS — Z87891 Personal history of nicotine dependence: Secondary | ICD-10-CM | POA: Diagnosis not present

## 2019-01-22 DIAGNOSIS — J441 Chronic obstructive pulmonary disease with (acute) exacerbation: Secondary | ICD-10-CM | POA: Diagnosis not present

## 2019-01-22 DIAGNOSIS — R911 Solitary pulmonary nodule: Secondary | ICD-10-CM | POA: Diagnosis not present

## 2019-01-22 DIAGNOSIS — J701 Chronic and other pulmonary manifestations due to radiation: Secondary | ICD-10-CM | POA: Diagnosis not present

## 2019-01-22 DIAGNOSIS — C3411 Malignant neoplasm of upper lobe, right bronchus or lung: Secondary | ICD-10-CM | POA: Diagnosis not present

## 2019-01-22 DIAGNOSIS — J9611 Chronic respiratory failure with hypoxia: Secondary | ICD-10-CM | POA: Diagnosis not present

## 2019-01-23 DIAGNOSIS — G894 Chronic pain syndrome: Secondary | ICD-10-CM | POA: Diagnosis not present

## 2019-01-23 DIAGNOSIS — M961 Postlaminectomy syndrome, not elsewhere classified: Secondary | ICD-10-CM | POA: Diagnosis not present

## 2019-01-23 DIAGNOSIS — G8929 Other chronic pain: Secondary | ICD-10-CM | POA: Diagnosis not present

## 2019-01-23 DIAGNOSIS — Z79891 Long term (current) use of opiate analgesic: Secondary | ICD-10-CM | POA: Diagnosis not present

## 2019-01-23 DIAGNOSIS — M792 Neuralgia and neuritis, unspecified: Secondary | ICD-10-CM | POA: Diagnosis not present

## 2019-01-23 DIAGNOSIS — R0789 Other chest pain: Secondary | ICD-10-CM | POA: Diagnosis not present

## 2019-01-23 DIAGNOSIS — Z79899 Other long term (current) drug therapy: Secondary | ICD-10-CM | POA: Diagnosis not present

## 2019-02-07 DIAGNOSIS — J449 Chronic obstructive pulmonary disease, unspecified: Secondary | ICD-10-CM | POA: Diagnosis not present

## 2019-02-07 DIAGNOSIS — J9611 Chronic respiratory failure with hypoxia: Secondary | ICD-10-CM | POA: Diagnosis not present

## 2019-02-07 DIAGNOSIS — J701 Chronic and other pulmonary manifestations due to radiation: Secondary | ICD-10-CM | POA: Diagnosis not present

## 2019-02-07 DIAGNOSIS — R918 Other nonspecific abnormal finding of lung field: Secondary | ICD-10-CM | POA: Diagnosis not present

## 2019-02-07 DIAGNOSIS — I700 Atherosclerosis of aorta: Secondary | ICD-10-CM | POA: Diagnosis not present

## 2019-02-13 ENCOUNTER — Other Ambulatory Visit: Payer: Self-pay | Admitting: Cardiology

## 2019-02-14 NOTE — Telephone Encounter (Deleted)
Xarelto refill sent

## 2019-02-15 DIAGNOSIS — C3411 Malignant neoplasm of upper lobe, right bronchus or lung: Secondary | ICD-10-CM | POA: Diagnosis not present

## 2019-02-22 ENCOUNTER — Other Ambulatory Visit: Payer: Self-pay

## 2019-02-26 DIAGNOSIS — J449 Chronic obstructive pulmonary disease, unspecified: Secondary | ICD-10-CM | POA: Diagnosis not present

## 2019-02-26 DIAGNOSIS — I700 Atherosclerosis of aorta: Secondary | ICD-10-CM | POA: Diagnosis not present

## 2019-02-26 DIAGNOSIS — I2510 Atherosclerotic heart disease of native coronary artery without angina pectoris: Secondary | ICD-10-CM | POA: Diagnosis not present

## 2019-02-26 DIAGNOSIS — R911 Solitary pulmonary nodule: Secondary | ICD-10-CM | POA: Diagnosis not present

## 2019-02-26 DIAGNOSIS — C3411 Malignant neoplasm of upper lobe, right bronchus or lung: Secondary | ICD-10-CM | POA: Diagnosis not present

## 2019-03-08 DIAGNOSIS — Z510 Encounter for antineoplastic radiation therapy: Secondary | ICD-10-CM | POA: Diagnosis not present

## 2019-03-08 DIAGNOSIS — C3431 Malignant neoplasm of lower lobe, right bronchus or lung: Secondary | ICD-10-CM | POA: Diagnosis not present

## 2019-03-08 DIAGNOSIS — C3411 Malignant neoplasm of upper lobe, right bronchus or lung: Secondary | ICD-10-CM | POA: Diagnosis not present

## 2019-03-15 ENCOUNTER — Other Ambulatory Visit: Payer: Self-pay | Admitting: Cardiology

## 2019-03-19 DIAGNOSIS — N390 Urinary tract infection, site not specified: Secondary | ICD-10-CM | POA: Diagnosis not present

## 2019-03-27 DIAGNOSIS — C3411 Malignant neoplasm of upper lobe, right bronchus or lung: Secondary | ICD-10-CM | POA: Diagnosis not present

## 2019-03-27 DIAGNOSIS — Z510 Encounter for antineoplastic radiation therapy: Secondary | ICD-10-CM | POA: Diagnosis not present

## 2019-03-27 DIAGNOSIS — C3431 Malignant neoplasm of lower lobe, right bronchus or lung: Secondary | ICD-10-CM | POA: Diagnosis not present

## 2019-03-28 DIAGNOSIS — C3411 Malignant neoplasm of upper lobe, right bronchus or lung: Secondary | ICD-10-CM | POA: Diagnosis not present

## 2019-03-28 DIAGNOSIS — Z510 Encounter for antineoplastic radiation therapy: Secondary | ICD-10-CM | POA: Diagnosis not present

## 2019-04-02 DIAGNOSIS — Z510 Encounter for antineoplastic radiation therapy: Secondary | ICD-10-CM | POA: Diagnosis not present

## 2019-04-02 DIAGNOSIS — C3411 Malignant neoplasm of upper lobe, right bronchus or lung: Secondary | ICD-10-CM | POA: Diagnosis not present

## 2019-04-04 DIAGNOSIS — C3411 Malignant neoplasm of upper lobe, right bronchus or lung: Secondary | ICD-10-CM | POA: Diagnosis not present

## 2019-04-04 DIAGNOSIS — Z510 Encounter for antineoplastic radiation therapy: Secondary | ICD-10-CM | POA: Diagnosis not present

## 2019-04-06 DIAGNOSIS — Z510 Encounter for antineoplastic radiation therapy: Secondary | ICD-10-CM | POA: Diagnosis not present

## 2019-04-06 DIAGNOSIS — C3411 Malignant neoplasm of upper lobe, right bronchus or lung: Secondary | ICD-10-CM | POA: Diagnosis not present

## 2019-04-09 DIAGNOSIS — C3411 Malignant neoplasm of upper lobe, right bronchus or lung: Secondary | ICD-10-CM | POA: Diagnosis not present

## 2019-04-09 DIAGNOSIS — Z510 Encounter for antineoplastic radiation therapy: Secondary | ICD-10-CM | POA: Diagnosis not present

## 2019-04-11 DIAGNOSIS — C3431 Malignant neoplasm of lower lobe, right bronchus or lung: Secondary | ICD-10-CM | POA: Diagnosis not present

## 2019-04-11 DIAGNOSIS — C3411 Malignant neoplasm of upper lobe, right bronchus or lung: Secondary | ICD-10-CM | POA: Diagnosis not present

## 2019-04-11 DIAGNOSIS — Z510 Encounter for antineoplastic radiation therapy: Secondary | ICD-10-CM | POA: Diagnosis not present

## 2019-04-18 ENCOUNTER — Other Ambulatory Visit: Payer: Self-pay | Admitting: Cardiology

## 2019-04-24 DIAGNOSIS — R0789 Other chest pain: Secondary | ICD-10-CM | POA: Diagnosis not present

## 2019-04-24 DIAGNOSIS — G8929 Other chronic pain: Secondary | ICD-10-CM | POA: Diagnosis not present

## 2019-04-24 DIAGNOSIS — G893 Neoplasm related pain (acute) (chronic): Secondary | ICD-10-CM | POA: Diagnosis not present

## 2019-04-24 DIAGNOSIS — Z79899 Other long term (current) drug therapy: Secondary | ICD-10-CM | POA: Diagnosis not present

## 2019-04-24 DIAGNOSIS — M792 Neuralgia and neuritis, unspecified: Secondary | ICD-10-CM | POA: Diagnosis not present

## 2019-04-24 DIAGNOSIS — Z79891 Long term (current) use of opiate analgesic: Secondary | ICD-10-CM | POA: Diagnosis not present

## 2019-04-24 DIAGNOSIS — M961 Postlaminectomy syndrome, not elsewhere classified: Secondary | ICD-10-CM | POA: Diagnosis not present

## 2019-04-24 DIAGNOSIS — Z9689 Presence of other specified functional implants: Secondary | ICD-10-CM | POA: Diagnosis not present

## 2019-05-11 DIAGNOSIS — M961 Postlaminectomy syndrome, not elsewhere classified: Secondary | ICD-10-CM | POA: Diagnosis not present

## 2019-05-11 DIAGNOSIS — Z0000 Encounter for general adult medical examination without abnormal findings: Secondary | ICD-10-CM | POA: Diagnosis not present

## 2019-05-11 DIAGNOSIS — R300 Dysuria: Secondary | ICD-10-CM | POA: Diagnosis not present

## 2019-05-11 DIAGNOSIS — Z85118 Personal history of other malignant neoplasm of bronchus and lung: Secondary | ICD-10-CM | POA: Diagnosis not present

## 2019-05-11 DIAGNOSIS — Z23 Encounter for immunization: Secondary | ICD-10-CM | POA: Diagnosis not present

## 2019-05-15 ENCOUNTER — Other Ambulatory Visit: Payer: Self-pay | Admitting: Cardiology

## 2019-05-22 DIAGNOSIS — M792 Neuralgia and neuritis, unspecified: Secondary | ICD-10-CM | POA: Diagnosis not present

## 2019-05-22 DIAGNOSIS — G894 Chronic pain syndrome: Secondary | ICD-10-CM | POA: Diagnosis not present

## 2019-05-22 DIAGNOSIS — M961 Postlaminectomy syndrome, not elsewhere classified: Secondary | ICD-10-CM | POA: Diagnosis not present

## 2019-05-22 DIAGNOSIS — Z79899 Other long term (current) drug therapy: Secondary | ICD-10-CM | POA: Diagnosis not present

## 2019-05-22 DIAGNOSIS — Z9689 Presence of other specified functional implants: Secondary | ICD-10-CM | POA: Diagnosis not present

## 2019-05-22 DIAGNOSIS — M5416 Radiculopathy, lumbar region: Secondary | ICD-10-CM | POA: Diagnosis not present

## 2019-05-29 DIAGNOSIS — Z79899 Other long term (current) drug therapy: Secondary | ICD-10-CM | POA: Diagnosis not present

## 2019-05-29 DIAGNOSIS — R778 Other specified abnormalities of plasma proteins: Secondary | ICD-10-CM | POA: Diagnosis not present

## 2019-05-29 DIAGNOSIS — Y92092 Bedroom in other non-institutional residence as the place of occurrence of the external cause: Secondary | ICD-10-CM | POA: Diagnosis not present

## 2019-05-29 DIAGNOSIS — M25551 Pain in right hip: Secondary | ICD-10-CM | POA: Diagnosis not present

## 2019-05-29 DIAGNOSIS — R05 Cough: Secondary | ICD-10-CM | POA: Diagnosis not present

## 2019-05-29 DIAGNOSIS — Z993 Dependence on wheelchair: Secondary | ICD-10-CM | POA: Diagnosis not present

## 2019-05-29 DIAGNOSIS — Z96643 Presence of artificial hip joint, bilateral: Secondary | ICD-10-CM | POA: Diagnosis not present

## 2019-05-29 DIAGNOSIS — J449 Chronic obstructive pulmonary disease, unspecified: Secondary | ICD-10-CM | POA: Diagnosis not present

## 2019-05-29 DIAGNOSIS — G8929 Other chronic pain: Secondary | ICD-10-CM | POA: Diagnosis not present

## 2019-05-29 DIAGNOSIS — Y9301 Activity, walking, marching and hiking: Secondary | ICD-10-CM | POA: Diagnosis not present

## 2019-05-29 DIAGNOSIS — Z923 Personal history of irradiation: Secondary | ICD-10-CM | POA: Diagnosis not present

## 2019-05-29 DIAGNOSIS — Z85118 Personal history of other malignant neoplasm of bronchus and lung: Secondary | ICD-10-CM | POA: Diagnosis not present

## 2019-05-29 DIAGNOSIS — R7989 Other specified abnormal findings of blood chemistry: Secondary | ICD-10-CM | POA: Diagnosis not present

## 2019-05-29 DIAGNOSIS — Z981 Arthrodesis status: Secondary | ICD-10-CM | POA: Diagnosis not present

## 2019-05-29 DIAGNOSIS — Z87891 Personal history of nicotine dependence: Secondary | ICD-10-CM | POA: Diagnosis not present

## 2019-05-29 DIAGNOSIS — Z955 Presence of coronary angioplasty implant and graft: Secondary | ICD-10-CM | POA: Diagnosis not present

## 2019-05-29 DIAGNOSIS — R0789 Other chest pain: Secondary | ICD-10-CM | POA: Diagnosis not present

## 2019-05-29 DIAGNOSIS — W010XXA Fall on same level from slipping, tripping and stumbling without subsequent striking against object, initial encounter: Secondary | ICD-10-CM | POA: Diagnosis not present

## 2019-05-29 DIAGNOSIS — Z86718 Personal history of other venous thrombosis and embolism: Secondary | ICD-10-CM | POA: Diagnosis not present

## 2019-05-29 DIAGNOSIS — Y998 Other external cause status: Secondary | ICD-10-CM | POA: Diagnosis not present

## 2019-05-29 DIAGNOSIS — M549 Dorsalgia, unspecified: Secondary | ICD-10-CM | POA: Diagnosis not present

## 2019-05-29 DIAGNOSIS — I2510 Atherosclerotic heart disease of native coronary artery without angina pectoris: Secondary | ICD-10-CM | POA: Diagnosis not present

## 2019-05-29 DIAGNOSIS — R9431 Abnormal electrocardiogram [ECG] [EKG]: Secondary | ICD-10-CM | POA: Diagnosis not present

## 2019-05-29 DIAGNOSIS — G40909 Epilepsy, unspecified, not intractable, without status epilepticus: Secondary | ICD-10-CM | POA: Diagnosis not present

## 2019-05-29 DIAGNOSIS — R2681 Unsteadiness on feet: Secondary | ICD-10-CM | POA: Diagnosis not present

## 2019-05-29 DIAGNOSIS — S79911A Unspecified injury of right hip, initial encounter: Secondary | ICD-10-CM | POA: Diagnosis not present

## 2019-05-29 DIAGNOSIS — Z7901 Long term (current) use of anticoagulants: Secondary | ICD-10-CM | POA: Diagnosis not present

## 2019-05-29 DIAGNOSIS — I2690 Septic pulmonary embolism without acute cor pulmonale: Secondary | ICD-10-CM | POA: Diagnosis not present

## 2019-05-29 DIAGNOSIS — M2559 Pain in other specified joint: Secondary | ICD-10-CM | POA: Diagnosis not present

## 2019-05-29 DIAGNOSIS — Z86711 Personal history of pulmonary embolism: Secondary | ICD-10-CM | POA: Diagnosis not present

## 2019-05-30 DIAGNOSIS — Z951 Presence of aortocoronary bypass graft: Secondary | ICD-10-CM | POA: Diagnosis not present

## 2019-05-30 DIAGNOSIS — J449 Chronic obstructive pulmonary disease, unspecified: Secondary | ICD-10-CM | POA: Diagnosis not present

## 2019-05-30 DIAGNOSIS — I2510 Atherosclerotic heart disease of native coronary artery without angina pectoris: Secondary | ICD-10-CM | POA: Diagnosis not present

## 2019-05-30 DIAGNOSIS — R7989 Other specified abnormal findings of blood chemistry: Secondary | ICD-10-CM | POA: Diagnosis not present

## 2019-06-20 ENCOUNTER — Other Ambulatory Visit: Payer: Self-pay | Admitting: Cardiology

## 2019-07-06 DIAGNOSIS — I700 Atherosclerosis of aorta: Secondary | ICD-10-CM | POA: Diagnosis not present

## 2019-07-06 DIAGNOSIS — J439 Emphysema, unspecified: Secondary | ICD-10-CM | POA: Diagnosis not present

## 2019-07-06 DIAGNOSIS — C3411 Malignant neoplasm of upper lobe, right bronchus or lung: Secondary | ICD-10-CM | POA: Diagnosis not present

## 2019-07-24 ENCOUNTER — Telehealth: Payer: Self-pay | Admitting: Cardiology

## 2019-07-24 NOTE — Telephone Encounter (Signed)
Lam on second call requesting patient set up appt or no refills

## 2019-07-24 NOTE — Telephone Encounter (Signed)
Nevin Bloodgood I understand you are already working on this . Thanks

## 2019-07-24 NOTE — Telephone Encounter (Signed)
Patient was called to set up appt, lam stating her script would not be refilled unless she scheduled an appt.

## 2019-07-24 NOTE — Telephone Encounter (Signed)
Patients husband calling to set up appt for wife so she can get her medication refilled. Due to the virus, they do not want to come into the office. I dont see where any designated time slots are virtual for Dr. Bettina Gavia, is there any way that one of his office visits can turn to virtual?

## 2019-07-25 NOTE — Telephone Encounter (Signed)
Spoke to patient and spouse and they are going to try and have PCP fill this medication. Spouse reports she has two nodules and they are watching them. They will call back if appt needed.

## 2019-07-30 ENCOUNTER — Other Ambulatory Visit: Payer: Self-pay | Admitting: *Deleted

## 2019-07-30 ENCOUNTER — Telehealth: Payer: Self-pay | Admitting: Cardiology

## 2019-07-30 MED ORDER — SIMVASTATIN 20 MG PO TABS: 20.0000 mg | ORAL_TABLET | Freq: Every day | ORAL | 2 refills | Status: DC

## 2019-07-30 NOTE — Telephone Encounter (Signed)
Refill sent.

## 2019-07-30 NOTE — Telephone Encounter (Signed)
  Patient is out   1. Which medications need to be refilled? (please list name of each medication and dose if known) simvistatin  2. Which pharmacy/location (including street and city if local pharmacy) is medication to be sent to? [place] [facility]  3. Do they need a 30 day or 90 day supply? [place]

## 2019-08-20 ENCOUNTER — Other Ambulatory Visit: Payer: Self-pay | Admitting: Cardiology

## 2019-08-22 DIAGNOSIS — Z7409 Other reduced mobility: Secondary | ICD-10-CM | POA: Insufficient documentation

## 2019-08-22 NOTE — Progress Notes (Signed)
Cardiology Office Note:    Date:  08/23/2019   ID:  Cheyenne [name], DOB 06/05/1941, MRN [id number]  PCP:  Cheyenne Flow, MD  Cardiologist:  Cheyenne More, MD    Referring MD: Cheyenne Peer, MD    ASSESSMENT:    1. Coronary artery calcification seen on CAT scan   2. Hypertensive heart disease without heart failure   3. Hyperlipidemia, unspecified hyperlipidemia type   4. Chronic anticoagulation    PLAN:    In order of problems listed above:  1. She is having no evidence of ischemia no anginal discomfort has had a previous normal myocardial perfusion study at this time I would not perform further CAD testing and continue treatment with her anticoagulant and statin. 2. Stable Home blood pressures in range intermittently she takes an extra half tablet of ARB in the evening if systolics are greater than 150. 3. Continue her statin check lipid profile 4. Continue anticoagulation for venous thromboembolism associated with cancer.  Change out of the generator from her pain stimulator superficial and I told her to hold her anticoagulant 2 days prior   Next appointment: 1 year   Medication Adjustments/Labs and Tests Ordered: Current medicines are reviewed at length with the patient today.  Concerns regarding medicines are outlined above.  No orders of the defined types were placed in this encounter.  No orders of the defined types were placed in this encounter.   Chief Complaint  Patient presents with  . Follow-up    Atherosclerosis coronary artery calcification on CT scan    History of Present Illness:    Cheyenne [name] is a 79 y.o. female with a hx of COPD with chronic hypoxic respiratory failure, venous thromboembolism, CAC-coronary atherosclerosis, Hyperlipidemia, Hypertension, Peripheral Vascular Disease, S/P PCI, and right lung cancer Stage IA (T1a, N0, M0) treated with XRT on long term anticoagulation  last seen 08/31/2018.She had a normal MPI at [facility] in January 2018 for  evaluation of chest pain and coronary artery calcification..   Compliance with diet, lifestyle and medications: Yes  She is here to have her simvastatin renewed.  She is compliant with medication no muscle pain or weakness overdue and will have a CMP lipid profile.  Recently got bad news that she has new lung nodules and is seeing an oncologist at [name].  No angina orthopnea palpitation or syncope but finds herself depressed and weak during COVID-19.  She has had her first dose of Pfizer vaccine. Past Medical History:  Diagnosis Date  . Acute deep vein thrombosis (DVT) of femoral vein of left lower extremity (Cheyenne [name]) 07/08/2015  . Acute embolism and thrombosis of unspecified deep veins of left proximal lower extremity (Cheyenne [name]) 08/12/2015  . Acute pulmonary embolism (Cheyenne [name]) 07/08/2015  . Adult hypothyroidism 12/21/2011  . Anemia 08/12/2015  . 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 Arteriosclerosis of coronary artery 12/21/2011   Overview:  2 vessel s/p PCI and Vision stent to ostial RCA and PTCA of DI (02/2009).  Lexiscan MPS (10/20/11) revealed normal perfusion and function, EF 66%.  Last Assessment & Plan:  Relevant Hx: Course: Daily Update: Today's Plan:   . At risk for falls 03/05/2014  . Atherosclerotic cerebrovascular disease 04/10/2012  . Back pain, chronic 04/10/2012  . Bladder infection, chronic 05/29/2014  . BP (high blood pressure) 05/24/2011  . Carotid atherosclerosis, bilateral 07/24/2016  . Cerebral seizure (Cheyenne [name]) 03/13/2012  . 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 Chronic  kidney disease (CKD), stage III (moderate) 05/24/2011  . Chronic pain associated with significant psychosocial dysfunction 11/09/2012  . Chronic pain syndrome 11/09/2012  . Chronic respiratory failure (Cheyenne [name]) 01/08/2014  . Coccyalgia 02/08/2013  . 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 COPD with emphysema Gold C  01/08/2014   Cheyenne [name] 01/08/2014: FeV1 52% FVC 68% FeV1/FVC 57%    CT Chest 11/2013: emphysematous changes 06/17/2014 CT Chest: no nodule seen, emphysema only    . DDD (degenerative disc disease), lumbar 05/07/2014  . DNR (do not resuscitate) 07/14/2015  . Failure to thrive (0-17) 08/12/2015  . Gastro-esophageal reflux disease without esophagitis 05/07/2014  . H/O deep venous thrombosis 04/10/2012   Overview:  postoperative Overview:  postoperative   . High risk medication use 03/05/2014  . Hyperlipidemia   . Hypertensive heart disease   . Hypoxemia 11/17/2015  . Iron deficiency 09/14/2011  . Kidney cysts 04/20/2013  . Lower urinary tract infectious disease 04/10/2012  . Major depressive disorder 08/12/2015  . Malignant neoplasm of upper lobe of right lung (Cheyenne [name]) 09/05/2015  . Multi-vessel coronary artery stenosis   . Nerve root pain 05/07/2014  . Opioid type dependence, abuse (Cheyenne [name]) 01/19/2013  . Osteoarthritis of spine with radiculopathy, lumbar region 05/07/2014  . Osteoporosis 12/30/2012  . Personal history of other venous thrombosis and embolism 04/10/2012   Overview:  Overview:  Overview:  postoperative Overview:  postoperative Overview:  postoperative  . PVD (peripheral vascular disease) (Cheyenne [name])   . Recurrent UTI 05/23/2014  . S/P lumbar spine operation 02/03/2012  . Seizure disorder ([place])   . SIRS (systemic inflammatory response syndrome) (Cheyenne [name]) 07/07/2015  . Spondylosis, lumbosacral   . Stricture of artery Cheyenne [name])     Past Surgical History:  Procedure Laterality Date  . APPENDECTOMY    . BACK SURGERY    . JOINT REPLACEMENT    . SPINAL CORD STIMULATOR IMPLANT  2010  . stent  1990  . VESICOVAGINAL FISTULA CLOSURE W/ TAH  1962    Current Medications: Current Meds  Medication Sig  . acetaminophen (TYLENOL) 325 MG tablet Take 650 mg by mouth every 6 (six) hours as needed.  Marland Kitchen albuterol (PROVENTIL) (2.5 MG/3ML) 0.083% nebulizer solution Take 2.5 mg by nebulization every 4 (four) hours as needed  for [name].   Marland Kitchen amitriptyline (ELAVIL) 50 MG tablet Take 50 mg by mouth at bedtime.  . bisacodyl (BISCOLAX) 10 MG suppository Place 5 mg rectally daily.  . buprenorphine (BUTRANS) 20 MCG/HR PTWK patch Place onto the skin once a week.  . Buprenorphine HCl (BELBUCA) 600 MCG FILM Place inside cheek daily.  . Cholecalciferol 1000 UNITS capsule Take 1,000 Units by mouth daily.  . DULoxetine (CYMBALTA) 30 MG capsule Take 30 mg by mouth 2 (two) times daily.  . folic acid (FOLVITE) 1 MG tablet Take 1 mg by mouth daily.  Marland Kitchen gabapentin (NEURONTIN) 300 MG capsule Take 300 mg by mouth 4 (four) times daily.  Marland Kitchen levETIRAcetam (KEPPRA) 1000 MG tablet TAKE 1 TABLET BID  . levothyroxine (SYNTHROID, LEVOTHROID) 50 MCG tablet Take 50 mcg by mouth daily before breakfast.  . lidocaine (XYLOCAINE) 5 % ointment 1 application daily as needed for moderate pain.   Marland Kitchen loperamide (IMODIUM) 2 MG capsule Take 2 mg by mouth as needed for diarrhea or loose stools.  Marland Kitchen losartan (COZAAR) 100 MG tablet Take 1 tablet by mouth once (1) daily in the morning AND 1/2 TABLET AT NIGHT IF needed.  . Multiple Vitamins-Minerals ([employer] SPECTRAVITE ADULT 50+ PO) Take 1 tablet  by mouth daily.  . nitroGLYCERIN (NITROSTAT) 0.4 MG SL tablet Place 0.4 mg under the tongue every 5 (five) minutes as needed for chest pain.  Marland Kitchen omeprazole (PRILOSEC) 40 MG capsule Take 40 mg by mouth 2 (two) times daily.  . ondansetron (ZOFRAN) 4 MG tablet Take 8 mg by mouth every 8 (eight) hours as needed for vomiting.   . OXYGEN Inhale into the lungs. 2 lpm  . simvastatin (ZOCOR) 20 MG tablet Take 1 tablet (20 mg total) by mouth daily.  Marland Kitchen thiamine (VITAMIN B-1) 100 MG tablet Take 100 mg by mouth 2 (two) times daily.  . Tiotropium Bromide Monohydrate (SPIRIVA RESPIMAT) 2.5 MCG/ACT AERS Inhale 2.5 mcg into the lungs as needed.  . vitamin C (ASCORBIC ACID) 500 MG tablet Take 250 mg by mouth daily.  Alveda Reasons 10 MG TABS tablet Take 1 tablet (10 mg total) by mouth  daily.  . [DISCONTINUED] DULoxetine (CYMBALTA) 30 MG capsule Take 30 mg by mouth 2 (two) times daily. Take 1 capsule three times  A day.  . [DISCONTINUED] gabapentin (NEURONTIN) 400 MG capsule Take 400 mg by mouth 2 (two) times daily.  . [DISCONTINUED] losartan (COZAAR) 50 MG tablet Take 1 tablet (50 mg total) by mouth daily as needed for up to 30 doses (for systolic > 951 mm Hg).     Allergies:   Asa [aspirin], Bee venom, Duloxetine hcl, Hornet venom, Phenergan [promethazine hcl], Penicillins, and Promethazine   Social History   Socioeconomic History  . Marital status: Single    Spouse name: Not on file  . Number of children: Not on file  . Years of education: Not on file  . Highest education level: Not on file  Occupational History  . Occupation: Retired    Comment: [employer]  Tobacco Use  . Smoking status: Former Smoker    Packs/day: 1.50    Years: 12.00    Pack years: 18.00    Types: Cigarettes    Quit date: 07/27/1995    Years since quitting: 24.0  . Smokeless tobacco: Never Used  Substance and Sexual Activity  . Alcohol use: No  . Drug use: No  . Sexual activity: Not on file  Other Topics Concern  . Not on file  Social History Narrative  . Not on file   Social Determinants of Health   Financial Resource Strain:   . Difficulty of Paying Living Expenses: Not on file  Food Insecurity:   . Worried About Charity fundraiser in the Last Year: Not on file  . Ran Out of Food in the Last Year: Not on file  Transportation Needs:   . Lack of Transportation (Medical): Not on file  . Lack of Transportation (Non-Medical): Not on file  Physical Activity:   . Days of Exercise per Week: Not on file  . Minutes of Exercise per Session: Not on file  Stress:   . Feeling of Stress : Not on file  Social Connections:   . Frequency of Communication with Friends and Family: Not on file  . Frequency of Social Gatherings with Friends and Family: Not on file  . Attends Religious  Services: Not on file  . Active Member of Clubs or Organizations: Not on file  . Attends Archivist Meetings: Not on file  . Marital Status: Not on file     Family History: The patient's family history includes Heart disease in her brother, father, and mother. ROS:   Please see the history of  present illness.    All other systems reviewed and are negative.  EKGs/Labs/Other Studies Reviewed:    The following studies were reviewed today:  EKG:  EKG ordered today and personally reviewed.  The ekg ordered today demonstrates sinus rhythm minor nonspecific T wave changes  Recent Labs: No results found for requested labs within last 8760 hours.  Recent Lipid Panel No results found for: CHOL, TRIG, HDL, CHOLHDL, VLDL, LDLCALC, LDLDIRECT  Physical Exam:    VS:  Ht 5\' 5"  (1.651 m)   Wt 160 lb (72.6 kg)   BMI 26.63 kg/m     Wt Readings from Last 3 Encounters:  08/23/19 160 lb (72.6 kg)  08/31/18 169 lb 6 oz (76.8 kg)  08/31/17 165 lb 1.9 oz (74.9 kg)     GEN:  Well nourished, well developed in no acute distress HEENT: Normal NECK: No JVD; No carotid bruits LYMPHATICS: No lymphadenopathy CARDIAC: RRR, no murmurs, rubs, gallops RESPIRATORY:  Clear to auscultation without rales, wheezing or rhonchi  ABDOMEN: Soft, non-tender, non-distended MUSCULOSKELETAL:  No edema; No deformity  SKIN: Warm and dry NEUROLOGIC:  Alert and oriented x 3 PSYCHIATRIC:  Normal affect    Signed, Cheyenne More, MD  08/23/2019 2:54 PM    [facility]

## 2019-08-23 ENCOUNTER — Encounter: Payer: Self-pay | Admitting: Cardiology

## 2019-08-23 ENCOUNTER — Ambulatory Visit (INDEPENDENT_AMBULATORY_CARE_PROVIDER_SITE_OTHER): Payer: Medicare Other | Admitting: Cardiology

## 2019-08-23 ENCOUNTER — Other Ambulatory Visit: Payer: Self-pay

## 2019-08-23 VITALS — Ht 65.0 in | Wt 160.0 lb

## 2019-08-23 DIAGNOSIS — E785 Hyperlipidemia, unspecified: Secondary | ICD-10-CM | POA: Diagnosis not present

## 2019-08-23 DIAGNOSIS — I119 Hypertensive heart disease without heart failure: Secondary | ICD-10-CM

## 2019-08-23 DIAGNOSIS — I2510 Atherosclerotic heart disease of native coronary artery without angina pectoris: Secondary | ICD-10-CM

## 2019-08-23 DIAGNOSIS — Z7901 Long term (current) use of anticoagulants: Secondary | ICD-10-CM | POA: Diagnosis not present

## 2019-08-23 MED ORDER — SIMVASTATIN 20 MG PO TABS: 20.0000 mg | ORAL_TABLET | Freq: Every day | ORAL | 3 refills | Status: DC

## 2019-08-23 NOTE — Patient Instructions (Signed)
Medication Instructions:  Hold Xarelto 2 days before generator   *If you need a refill on your cardiac medications before your next appointment, please call your pharmacy*  Lab Work: [place]  If you have labs (blood work) drawn today and your tests are completely normal, you will receive your results only by: Marland Kitchen MyChart Message (if you have MyChart) OR . A paper copy in the mail If you have any lab test that is abnormal or we need to change your treatment, we will call you to review the results.  Testing/Procedures: None ordered  Follow-Up: At [facility], you and your health needs are our priority.  As part of our continuing mission to provide you with exceptional heart care, we have created designated Provider Care Teams.  These Care Teams include your primary Cardiologist (physician) and Advanced Practice Providers (APPs -  Physician Assistants and Nurse Practitioners) who all work together to provide you with the care you need, when you need it.  Your next appointment:   12 month(s)  The format for your next appointment:   In Person  Provider:   Shirlee More, MD  Other Instructions None

## 2019-08-24 ENCOUNTER — Telehealth: Payer: Self-pay

## 2019-08-24 LAB — COMPREHENSIVE METABOLIC PANEL
ALT: 9 IU/L (ref 0–32)
AST: 17 IU/L (ref 0–40)
Albumin/Globulin Ratio: 1.1 — ABNORMAL LOW (ref 1.2–2.2)
Albumin: 3.9 g/dL (ref 3.7–4.7)
Alkaline Phosphatase: 119 IU/L — ABNORMAL HIGH (ref 39–117)
BUN/Creatinine Ratio: 19 (ref 12–28)
BUN: 15 mg/dL (ref 8–27)
Bilirubin Total: 0.2 mg/dL (ref 0.0–1.2)
CO2: 25 mmol/L (ref 20–29)
Calcium: 9.7 mg/dL (ref 8.7–10.3)
Chloride: 106 mmol/L (ref 96–106)
Creatinine, Ser: 0.81 mg/dL (ref 0.57–1.00)
GFR calc Af Amer: 79 mL/min/{1.73_m2} (ref >59)
GFR calc non Af Amer: 69 mL/min/{1.73_m2} (ref >59)
Globulin, Total: 3.6 g/dL (ref 1.5–4.5)
Glucose: 91 mg/dL (ref 65–99)
Potassium: 5.2 mmol/L (ref 3.5–5.2)
Sodium: 144 mmol/L (ref 134–144)
Total Protein: 7.5 g/dL (ref 6.0–8.5)

## 2019-08-24 LAB — LIPID PANEL
Chol/HDL Ratio: 1.9 ratio (ref 0.0–4.4)
Cholesterol, Total: 114 mg/dL (ref 100–199)
HDL: 59 mg/dL
LDL Chol Calc (NIH): 35 mg/dL (ref 0–99)
Triglycerides: 112 mg/dL (ref 0–149)
VLDL Cholesterol Cal: 20 mg/dL (ref 5–40)

## 2019-08-24 NOTE — Telephone Encounter (Signed)
-----   Message from Richardo Priest, MD sent at 08/24/2019  7:50 AM EST ----- Normal or stable result  These are good no change in treatment

## 2019-08-24 NOTE — Telephone Encounter (Signed)
Lpm with results 1/29

## 2019-08-31 ENCOUNTER — Telehealth: Payer: Self-pay | Admitting: Cardiology

## 2019-08-31 NOTE — Telephone Encounter (Signed)
Discontinue losartan very weak antihypertensive  Switch to either valsartan 80 mg twice daily or telmisartan 40 mg daily depending on prescription management

## 2019-08-31 NOTE — Telephone Encounter (Signed)
Telephone call to patient husband. Advised Dr Bettina Gavia wants her to go to the hospital to be evaluated for the L sided weakness. Husband verbalizes understandinng.

## 2019-08-31 NOTE — Telephone Encounter (Signed)
Advice is go directly to the hospital left-sided weakness for evaluation with stroke.  I think should be do best to call 911 for quick transfer

## 2019-08-31 NOTE — Telephone Encounter (Signed)
Pt c/o BP issue: STAT if pt c/o blurred vision, one-sided weakness or slurred speech  1. What are your last 5 BP readings?  02-05: 153/106 02-04: AM: 157/72 PM: 145/55  02-03: AM: 171/75  PM: 152/68 *extra 1/2 Losartan given 02-02: AM:162/76   PM: 145/69 *extra 1/2 Losartan given 02-01: AM: 164/81 PM:162/64  *extra 1/2 Losartan given  2. Are you having any other symptoms (ex. Dizziness, headache, blurred vision, passed out)?  L Sided weakness, SOB, fatigue  3. What is your BP issue? BP numbers are higher than normal.  Husband states that when the pt's BP gets high like this, he gives her an extra 1/2 tablet of losartan. He just wanted to know if he should do that again. He has done that a few times already this week. He is especially worried about the bottom number being so high

## 2019-08-31 NOTE — Telephone Encounter (Signed)
I called and spoke with the patient and her husband.  The patients lt sided weakness is not new as she has been having this sx for months.  The pt does not have slurred speech.  Their main concern is her BP.

## 2019-08-31 NOTE — Telephone Encounter (Signed)
Please advise 

## 2019-09-27 ENCOUNTER — Other Ambulatory Visit: Payer: Self-pay | Admitting: Cardiology

## 2019-09-27 MED ORDER — RIVAROXABAN 10 MG PO TABS: 10.0000 mg | ORAL_TABLET | Freq: Every day | ORAL | 3 refills | Status: AC

## 2019-09-27 NOTE — Telephone Encounter (Signed)
  *  STAT* If patient is at the pharmacy, call can be transferred to refill team.   1. Which medications need to be refilled? (please list name of each medication and dose if known) XARELTO 10 MG TABS tablet  2. Which pharmacy/location (including street and city if local pharmacy) is medication to be sent to? [place], [place]  3. Do they need a 30 day or 90 day supply? 90 days

## 2019-10-01 ENCOUNTER — Inpatient Hospital Stay
Admission: AD | Admit: 2019-10-01 | Payer: Medicare Other | Source: Other Acute Inpatient Hospital | Admitting: Internal Medicine

## 2019-10-01 DIAGNOSIS — N390 Urinary tract infection, site not specified: Secondary | ICD-10-CM

## 2019-10-01 DIAGNOSIS — I214 Non-ST elevation (NSTEMI) myocardial infarction: Secondary | ICD-10-CM | POA: Diagnosis not present

## 2019-10-01 DIAGNOSIS — J9601 Acute respiratory failure with hypoxia: Secondary | ICD-10-CM

## 2019-10-01 DIAGNOSIS — N179 Acute kidney failure, unspecified: Secondary | ICD-10-CM | POA: Diagnosis not present

## 2019-10-01 DIAGNOSIS — R918 Other nonspecific abnormal finding of lung field: Secondary | ICD-10-CM

## 2019-10-01 DIAGNOSIS — A419 Sepsis, unspecified organism: Secondary | ICD-10-CM

## 2019-10-01 DIAGNOSIS — R4182 Altered mental status, unspecified: Secondary | ICD-10-CM

## 2019-10-02 ENCOUNTER — Other Ambulatory Visit: Payer: Self-pay | Admitting: Cardiology

## 2019-10-02 DIAGNOSIS — A419 Sepsis, unspecified organism: Secondary | ICD-10-CM | POA: Diagnosis not present

## 2019-10-02 DIAGNOSIS — R4182 Altered mental status, unspecified: Secondary | ICD-10-CM | POA: Diagnosis not present

## 2019-10-02 DIAGNOSIS — J9601 Acute respiratory failure with hypoxia: Secondary | ICD-10-CM | POA: Diagnosis not present

## 2019-10-02 DIAGNOSIS — I214 Non-ST elevation (NSTEMI) myocardial infarction: Secondary | ICD-10-CM | POA: Diagnosis not present

## 2019-10-02 DIAGNOSIS — N179 Acute kidney failure, unspecified: Secondary | ICD-10-CM | POA: Diagnosis not present

## 2019-10-03 ENCOUNTER — Inpatient Hospital Stay
Admission: AD | Admit: 2019-10-03 | Payer: Medicare Other | Source: Other Acute Inpatient Hospital | Admitting: Cardiovascular Disease

## 2019-10-03 ENCOUNTER — Encounter (HOSPITAL_COMMUNITY): Payer: Self-pay | Admitting: Cardiology

## 2019-10-03 ENCOUNTER — Inpatient Hospital Stay (HOSPITAL_COMMUNITY)
Admission: RE | Admit: 2019-10-03 | Discharge: 2019-10-04 | DRG: 280 | Disposition: A | Discharge status: Home Health | Payer: Medicare Other | Source: Other Acute Inpatient Hospital | Attending: Cardiology | Admitting: Cardiology

## 2019-10-03 ENCOUNTER — Other Ambulatory Visit: Payer: Self-pay

## 2019-10-03 ENCOUNTER — Encounter (HOSPITAL_COMMUNITY)
Admission: RE | Disposition: A | Discharge status: Home Health | Payer: Self-pay | Source: Other Acute Inpatient Hospital | Attending: Cardiology

## 2019-10-03 DIAGNOSIS — I2699 Other pulmonary embolism without acute cor pulmonale: Secondary | ICD-10-CM

## 2019-10-03 DIAGNOSIS — R079 Chest pain, unspecified: Secondary | ICD-10-CM | POA: Diagnosis present

## 2019-10-03 DIAGNOSIS — Z86711 Personal history of pulmonary embolism: Secondary | ICD-10-CM

## 2019-10-03 DIAGNOSIS — Z7901 Long term (current) use of anticoagulants: Secondary | ICD-10-CM

## 2019-10-03 DIAGNOSIS — J9601 Acute respiratory failure with hypoxia: Secondary | ICD-10-CM | POA: Diagnosis not present

## 2019-10-03 DIAGNOSIS — N179 Acute kidney failure, unspecified: Secondary | ICD-10-CM | POA: Diagnosis present

## 2019-10-03 DIAGNOSIS — Z923 Personal history of irradiation: Secondary | ICD-10-CM | POA: Diagnosis not present

## 2019-10-03 DIAGNOSIS — I2510 Atherosclerotic heart disease of native coronary artery without angina pectoris: Secondary | ICD-10-CM | POA: Diagnosis present

## 2019-10-03 DIAGNOSIS — J439 Emphysema, unspecified: Secondary | ICD-10-CM | POA: Diagnosis present

## 2019-10-03 DIAGNOSIS — Z87440 Personal history of urinary (tract) infections: Secondary | ICD-10-CM | POA: Diagnosis not present

## 2019-10-03 DIAGNOSIS — E785 Hyperlipidemia, unspecified: Secondary | ICD-10-CM | POA: Diagnosis present

## 2019-10-03 DIAGNOSIS — N183 Chronic kidney disease, stage 3 unspecified: Secondary | ICD-10-CM | POA: Diagnosis present

## 2019-10-03 DIAGNOSIS — R4182 Altered mental status, unspecified: Secondary | ICD-10-CM

## 2019-10-03 DIAGNOSIS — I214 Non-ST elevation (NSTEMI) myocardial infarction: Secondary | ICD-10-CM

## 2019-10-03 DIAGNOSIS — K219 Gastro-esophageal reflux disease without esophagitis: Secondary | ICD-10-CM | POA: Diagnosis present

## 2019-10-03 DIAGNOSIS — Z85118 Personal history of other malignant neoplasm of bronchus and lung: Secondary | ICD-10-CM

## 2019-10-03 DIAGNOSIS — I119 Hypertensive heart disease without heart failure: Secondary | ICD-10-CM | POA: Diagnosis present

## 2019-10-03 DIAGNOSIS — I351 Nonrheumatic aortic (valve) insufficiency: Secondary | ICD-10-CM | POA: Diagnosis not present

## 2019-10-03 DIAGNOSIS — A419 Sepsis, unspecified organism: Secondary | ICD-10-CM | POA: Diagnosis present

## 2019-10-03 DIAGNOSIS — I739 Peripheral vascular disease, unspecified: Secondary | ICD-10-CM | POA: Diagnosis present

## 2019-10-03 DIAGNOSIS — I350 Nonrheumatic aortic (valve) stenosis: Secondary | ICD-10-CM | POA: Diagnosis not present

## 2019-10-03 DIAGNOSIS — I352 Nonrheumatic aortic (valve) stenosis with insufficiency: Secondary | ICD-10-CM | POA: Diagnosis not present

## 2019-10-03 DIAGNOSIS — Z86718 Personal history of other venous thrombosis and embolism: Secondary | ICD-10-CM

## 2019-10-03 DIAGNOSIS — E039 Hypothyroidism, unspecified: Secondary | ICD-10-CM | POA: Diagnosis present

## 2019-10-03 DIAGNOSIS — Z955 Presence of coronary angioplasty implant and graft: Secondary | ICD-10-CM

## 2019-10-03 DIAGNOSIS — I1310 Hypertensive heart and chronic kidney disease without heart failure, with stage 1 through stage 4 chronic kidney disease, or unspecified chronic kidney disease: Secondary | ICD-10-CM | POA: Diagnosis present

## 2019-10-03 DIAGNOSIS — R0609 Other forms of dyspnea: Secondary | ICD-10-CM

## 2019-10-03 DIAGNOSIS — I82409 Acute embolism and thrombosis of unspecified deep veins of unspecified lower extremity: Secondary | ICD-10-CM

## 2019-10-03 DIAGNOSIS — N390 Urinary tract infection, site not specified: Secondary | ICD-10-CM | POA: Diagnosis present

## 2019-10-03 DIAGNOSIS — C3490 Malignant neoplasm of unspecified part of unspecified bronchus or lung: Secondary | ICD-10-CM

## 2019-10-03 DIAGNOSIS — Z9981 Dependence on supplemental oxygen: Secondary | ICD-10-CM | POA: Diagnosis not present

## 2019-10-03 DIAGNOSIS — R6510 Systemic inflammatory response syndrome (SIRS) of non-infectious origin without acute organ dysfunction: Secondary | ICD-10-CM | POA: Diagnosis present

## 2019-10-03 HISTORY — PX: LEFT HEART CATH AND CORONARY ANGIOGRAPHY: CATH118249

## 2019-10-03 HISTORY — DX: Non-ST elevation (NSTEMI) myocardial infarction: I21.4

## 2019-10-03 LAB — CBC
HCT: 45 % (ref 36.0–46.0)
Hemoglobin: 14.5 g/dL (ref 12.0–15.0)
MCH: 27.5 pg (ref 26.0–34.0)
MCHC: 32.2 g/dL (ref 30.0–36.0)
MCV: 85.2 fL (ref 80.0–100.0)
Platelets: 230 10*3/uL (ref 150–400)
RBC: 5.28 MIL/uL — ABNORMAL HIGH (ref 3.87–5.11)
RDW: 14.7 % (ref 11.5–15.5)
WBC: 16.8 10*3/uL — ABNORMAL HIGH (ref 4.0–10.5)
nRBC: 0 % (ref 0.0–0.2)

## 2019-10-03 LAB — CREATININE, SERUM
Creatinine, Ser: 0.97 mg/dL (ref 0.44–1.00)
GFR calc Af Amer: >60 mL/min (ref >60)
GFR calc non Af Amer: 55 mL/min — ABNORMAL LOW (ref >60)

## 2019-10-03 SURGERY — LEFT HEART CATH AND CORONARY ANGIOGRAPHY
Anesthesia: LOCAL

## 2019-10-03 MED ORDER — IOHEXOL 350 MG/ML SOLN
INTRAVENOUS | Status: DC | PRN
  Administered 2019-10-03: 60 mL

## 2019-10-03 MED ORDER — LOSARTAN POTASSIUM 50 MG PO TABS
100.0000 mg | ORAL_TABLET | Freq: Every day | ORAL | Status: DC
  Administered 2019-10-03 – 2019-10-04 (×2): 100 mg via ORAL
  Filled 2019-10-03 (×2): qty 2

## 2019-10-03 MED ORDER — HEPARIN (PORCINE) IN NACL 1000-0.9 UT/500ML-% IV SOLN
INTRAVENOUS | Status: DC | PRN
  Administered 2019-10-03 (×2): 500 mL

## 2019-10-03 MED ORDER — SODIUM CHLORIDE 0.9 % IV SOLN: 250.0000 mL | INTRAVENOUS | Status: DC | PRN

## 2019-10-03 MED ORDER — VERAPAMIL HCL 2.5 MG/ML IV SOLN
INTRAVENOUS | Status: AC
  Filled 2019-10-03: qty 2

## 2019-10-03 MED ORDER — THIAMINE HCL 100 MG PO TABS
100.0000 mg | ORAL_TABLET | Freq: Two times a day (BID) | ORAL | Status: DC
  Administered 2019-10-04: 100 mg via ORAL
  Filled 2019-10-03 (×2): qty 1

## 2019-10-03 MED ORDER — ONDANSETRON HCL 4 MG PO TABS: 8.0000 mg | ORAL_TABLET | Freq: Three times a day (TID) | ORAL | Status: DC | PRN

## 2019-10-03 MED ORDER — NITROGLYCERIN 0.4 MG SL SUBL: 0.4000 mg | SUBLINGUAL_TABLET | SUBLINGUAL | Status: DC | PRN

## 2019-10-03 MED ORDER — ADULT MULTIVITAMIN W/MINERALS CH
ORAL_TABLET | Freq: Every day | ORAL | Status: DC
  Administered 2019-10-03 – 2019-10-04 (×2): 1 via ORAL
  Filled 2019-10-03 (×2): qty 1

## 2019-10-03 MED ORDER — GABAPENTIN 300 MG PO CAPS
300.0000 mg | ORAL_CAPSULE | Freq: Four times a day (QID) | ORAL | Status: DC
  Administered 2019-10-03 – 2019-10-04 (×3): 300 mg via ORAL
  Filled 2019-10-03 (×4): qty 1

## 2019-10-03 MED ORDER — HEPARIN SODIUM (PORCINE) 1000 UNIT/ML IJ SOLN
INTRAMUSCULAR | Status: AC
  Filled 2019-10-03: qty 1

## 2019-10-03 MED ORDER — ACETAMINOPHEN 325 MG PO TABS
650.0000 mg | ORAL_TABLET | ORAL | Status: DC | PRN
  Filled 2019-10-03: qty 2

## 2019-10-03 MED ORDER — SODIUM CHLORIDE 0.9% FLUSH: 3.0000 mL | INTRAVENOUS | Status: DC | PRN

## 2019-10-03 MED ORDER — SODIUM CHLORIDE 0.9 % WEIGHT BASED INFUSION: 1.0000 mL/kg/h | INTRAVENOUS | Status: DC

## 2019-10-03 MED ORDER — HEPARIN SODIUM (PORCINE) 1000 UNIT/ML IJ SOLN
INTRAMUSCULAR | Status: DC | PRN
  Administered 2019-10-03: 3500 [IU] via INTRAVENOUS

## 2019-10-03 MED ORDER — LIDOCAINE HCL (PF) 1 % IJ SOLN
INTRAMUSCULAR | Status: DC | PRN
  Administered 2019-10-03: 2 mL via INTRADERMAL

## 2019-10-03 MED ORDER — CARVEDILOL 25 MG PO TABS
25.0000 mg | ORAL_TABLET | Freq: Two times a day (BID) | ORAL | Status: DC
  Administered 2019-10-03 – 2019-10-04 (×3): 25 mg via ORAL
  Filled 2019-10-03 (×3): qty 1

## 2019-10-03 MED ORDER — SODIUM CHLORIDE 0.9% FLUSH
3.0000 mL | Freq: Two times a day (BID) | INTRAVENOUS | Status: DC
  Administered 2019-10-03 – 2019-10-04 (×2): 3 mL via INTRAVENOUS

## 2019-10-03 MED ORDER — PANTOPRAZOLE SODIUM 40 MG PO TBEC
40.0000 mg | DELAYED_RELEASE_TABLET | Freq: Every day | ORAL | Status: DC
  Administered 2019-10-03 – 2019-10-04 (×2): 40 mg via ORAL
  Filled 2019-10-03 (×2): qty 1

## 2019-10-03 MED ORDER — VITAMIN D 25 MCG (1000 UNIT) PO TABS
1000.0000 [IU] | ORAL_TABLET | Freq: Every day | ORAL | Status: DC
  Administered 2019-10-03 – 2019-10-04 (×2): 1000 [IU] via ORAL
  Filled 2019-10-03 (×2): qty 1

## 2019-10-03 MED ORDER — HEPARIN SODIUM (PORCINE) 5000 UNIT/ML IJ SOLN
5000.0000 [IU] | Freq: Three times a day (TID) | INTRAMUSCULAR | Status: DC
  Administered 2019-10-04: 5000 [IU] via SUBCUTANEOUS
  Filled 2019-10-03: qty 1

## 2019-10-03 MED ORDER — AMITRIPTYLINE HCL 50 MG PO TABS
50.0000 mg | ORAL_TABLET | Freq: Every day | ORAL | Status: DC
  Administered 2019-10-03: 50 mg via ORAL
  Filled 2019-10-03: qty 1

## 2019-10-03 MED ORDER — HEPARIN (PORCINE) IN NACL 1000-0.9 UT/500ML-% IV SOLN
INTRAVENOUS | Status: AC
  Filled 2019-10-03: qty 1500

## 2019-10-03 MED ORDER — SODIUM CHLORIDE 0.9 % WEIGHT BASED INFUSION: 1.0000 mL/kg/h | INTRAVENOUS | Status: AC

## 2019-10-03 MED ORDER — VERAPAMIL HCL 2.5 MG/ML IV SOLN
INTRAVENOUS | Status: DC | PRN
  Administered 2019-10-03: 10 mL via INTRA_ARTERIAL

## 2019-10-03 MED ORDER — LABETALOL HCL 5 MG/ML IV SOLN
INTRAVENOUS | Status: AC
  Filled 2019-10-03: qty 4

## 2019-10-03 MED ORDER — HYDRALAZINE HCL 20 MG/ML IJ SOLN: 10.0000 mg | INTRAMUSCULAR | Status: AC | PRN

## 2019-10-03 MED ORDER — LIDOCAINE HCL (PF) 1 % IJ SOLN
INTRAMUSCULAR | Status: AC
  Filled 2019-10-03: qty 30

## 2019-10-03 MED ORDER — SODIUM CHLORIDE 0.9% FLUSH: 3.0000 mL | Freq: Two times a day (BID) | INTRAVENOUS | Status: DC

## 2019-10-03 MED ORDER — LABETALOL HCL 5 MG/ML IV SOLN
10.0000 mg | INTRAVENOUS | Status: AC | PRN
  Administered 2019-10-03: 10 mg via INTRAVENOUS
  Filled 2019-10-03: qty 4

## 2019-10-03 MED ORDER — ASPIRIN 81 MG PO CHEW: 81.0000 mg | CHEWABLE_TABLET | ORAL | Status: DC

## 2019-10-03 MED ORDER — FOLIC ACID 1 MG PO TABS
1.0000 mg | ORAL_TABLET | Freq: Every day | ORAL | Status: DC
  Administered 2019-10-03 – 2019-10-04 (×2): 1 mg via ORAL
  Filled 2019-10-03 (×2): qty 1

## 2019-10-03 MED ORDER — ALBUTEROL SULFATE (2.5 MG/3ML) 0.083% IN NEBU: 2.5000 mg | INHALATION_SOLUTION | RESPIRATORY_TRACT | Status: DC | PRN

## 2019-10-03 MED ORDER — LEVOTHYROXINE SODIUM 50 MCG PO TABS
50.0000 ug | ORAL_TABLET | Freq: Every day | ORAL | Status: DC
  Administered 2019-10-04: 50 ug via ORAL
  Filled 2019-10-03: qty 2

## 2019-10-03 MED ORDER — LEVETIRACETAM 500 MG PO TABS
500.0000 mg | ORAL_TABLET | Freq: Two times a day (BID) | ORAL | Status: DC
  Administered 2019-10-04: 500 mg via ORAL
  Filled 2019-10-03 (×2): qty 1

## 2019-10-03 MED ORDER — SODIUM CHLORIDE 0.9 % WEIGHT BASED INFUSION: 3.0000 mL/kg/h | INTRAVENOUS | Status: DC

## 2019-10-03 MED ORDER — DULOXETINE HCL 30 MG PO CPEP
30.0000 mg | ORAL_CAPSULE | Freq: Two times a day (BID) | ORAL | Status: DC
  Administered 2019-10-04: 30 mg via ORAL
  Filled 2019-10-03 (×2): qty 1

## 2019-10-03 MED ORDER — ATORVASTATIN CALCIUM 40 MG PO TABS
40.0000 mg | ORAL_TABLET | Freq: Every day | ORAL | Status: DC
  Administered 2019-10-03: 40 mg via ORAL
  Filled 2019-10-03: qty 1

## 2019-10-03 MED ORDER — ONDANSETRON HCL 4 MG/2ML IJ SOLN: 4.0000 mg | Freq: Four times a day (QID) | INTRAMUSCULAR | Status: DC | PRN

## 2019-10-03 MED ORDER — AMITRIPTYLINE HCL 10 MG PO TABS
10.0000 mg | ORAL_TABLET | Freq: Every day | ORAL | Status: DC
  Filled 2019-10-03 (×2): qty 1

## 2019-10-03 MED ORDER — LABETALOL HCL 5 MG/ML IV SOLN
INTRAVENOUS | Status: DC | PRN
  Administered 2019-10-03 (×2): 10 mg via INTRAVENOUS

## 2019-10-03 SURGICAL SUPPLY — 14 items
CATH 5FR JL3.5 JR4 ANG PIG MP (CATHETERS) ×1 IMPLANT
CATH INFINITI 4FR JL3.5 (CATHETERS) ×1 IMPLANT
CATH LAUNCHER 5F RADL (CATHETERS) IMPLANT
CATHETER LAUNCHER 5F RADL (CATHETERS) ×2
DEVICE RAD COMP TR BAND LRG (VASCULAR PRODUCTS) ×1 IMPLANT
GLIDESHEATH SLEND SS 6F .021 (SHEATH) ×1 IMPLANT
GUIDEWIRE INQWIRE 1.5J.035X260 (WIRE) IMPLANT
INQWIRE 1.5J .035X260CM (WIRE) ×2
KIT HEART LEFT (KITS) ×2 IMPLANT
PACK CARDIAC CATHETERIZATION (CUSTOM PROCEDURE TRAY) ×2 IMPLANT
SHEATH PROBE COVER 6X72 (BAG) ×1 IMPLANT
TRANSDUCER W/STOPCOCK (MISCELLANEOUS) ×2 IMPLANT
TUBING CIL FLEX 10 FLL-RA (TUBING) ×2 IMPLANT
WIRE HI TORQ VERSACORE-J 145CM (WIRE) ×1 IMPLANT

## 2019-10-03 NOTE — H&P (Signed)
Patient transferred from [facility] today for evaluation of elevated troponins ? NSTEMI. Seen earlier today by our cardiology service there. Admitted with urosepsis and respiratory failure. H&P is in paper chart and will be scanned into Epic  Miller Limehouse [place] MD, [facility]

## 2019-10-03 NOTE — Progress Notes (Signed)
On arrival to holding area from [facility], SBP 82 on 45 mcg NTG. Patient chest pain free. NTG gtt turned off, SBP coming up. On arrival Heparin drip infusing at 900 units/hr to rt forearm w/0.9NS at 100cc/hr

## 2019-10-03 NOTE — Plan of Care (Signed)
  Problem: Clinical Measurements: Goal: Diagnostic test results will improve Outcome: Progressing   Problem: Coping: Goal: Level of anxiety will decrease Outcome: Progressing   Problem: Safety: Goal: Ability to remain free from injury will improve Outcome: Progressing   

## 2019-10-03 NOTE — Interval H&P Note (Signed)
History and Physical Interval Note:  10/03/2019 3:02 PM  Cheyenne [name]  has presented today for surgery, with the diagnosis of chest pain.  The various methods of treatment have been discussed with the patient and family. After consideration of risks, benefits and other options for treatment, the patient has consented to  Procedure(s): LEFT HEART CATH AND CORONARY ANGIOGRAPHY (N/A) as a surgical intervention.  The patient's history has been reviewed, patient examined, no change in status, stable for surgery.  I have reviewed the patient's chart and labs.  Questions were answered to the patient's satisfaction.   Cath Lab Visit (complete for each Cath Lab visit)  Clinical Evaluation Leading to the Procedure:   ACS: Yes.    Non-ACS:    Anginal Classification: CCS II  Anti-ischemic medical therapy: Maximal Therapy (2 or more classes of medications)  Non-Invasive Test Results: No non-invasive testing performed  Prior CABG: No previous CABG        Collier Salina [facility] 10/03/2019 3:02 PM

## 2019-10-04 ENCOUNTER — Inpatient Hospital Stay (HOSPITAL_COMMUNITY): Payer: Medicare Other

## 2019-10-04 DIAGNOSIS — I82409 Acute embolism and thrombosis of unspecified deep veins of unspecified lower extremity: Secondary | ICD-10-CM

## 2019-10-04 DIAGNOSIS — N390 Urinary tract infection, site not specified: Secondary | ICD-10-CM

## 2019-10-04 DIAGNOSIS — I351 Nonrheumatic aortic (valve) insufficiency: Secondary | ICD-10-CM

## 2019-10-04 DIAGNOSIS — I350 Nonrheumatic aortic (valve) stenosis: Secondary | ICD-10-CM

## 2019-10-04 DIAGNOSIS — I119 Hypertensive heart disease without heart failure: Secondary | ICD-10-CM

## 2019-10-04 DIAGNOSIS — C3490 Malignant neoplasm of unspecified part of unspecified bronchus or lung: Secondary | ICD-10-CM

## 2019-10-04 DIAGNOSIS — R4182 Altered mental status, unspecified: Secondary | ICD-10-CM

## 2019-10-04 DIAGNOSIS — E785 Hyperlipidemia, unspecified: Secondary | ICD-10-CM

## 2019-10-04 HISTORY — DX: Acute embolism and thrombosis of unspecified deep veins of unspecified lower extremity: I82.409

## 2019-10-04 LAB — URINALYSIS, ROUTINE W REFLEX MICROSCOPIC
Bilirubin Urine: NEGATIVE
Glucose, UA: NEGATIVE mg/dL
Ketones, ur: 5 mg/dL — AB
Nitrite: POSITIVE — AB
Protein, ur: 100 mg/dL — AB
RBC / HPF: >50 RBC/hpf — ABNORMAL HIGH (ref 0–5)
Specific Gravity, Urine: 1.015 (ref 1.005–1.030)
WBC, UA: >50 WBC/hpf — ABNORMAL HIGH (ref 0–5)
pH: 6 (ref 5.0–8.0)

## 2019-10-04 LAB — ECHOCARDIOGRAM COMPLETE
Height: 65 in
Weight: 2518.54 oz

## 2019-10-04 LAB — BASIC METABOLIC PANEL
Anion gap: 12 (ref 5–15)
BUN: 16 mg/dL (ref 8–23)
CO2: 26 mmol/L (ref 22–32)
Calcium: 9 mg/dL (ref 8.9–10.3)
Chloride: 102 mmol/L (ref 98–111)
Creatinine, Ser: 1.09 mg/dL — ABNORMAL HIGH (ref 0.44–1.00)
GFR calc Af Amer: 56 mL/min — ABNORMAL LOW (ref >60)
GFR calc non Af Amer: 48 mL/min — ABNORMAL LOW (ref >60)
Glucose, Bld: 93 mg/dL (ref 70–99)
Potassium: 3.9 mmol/L (ref 3.5–5.1)
Sodium: 140 mmol/L (ref 135–145)

## 2019-10-04 LAB — HEPATIC FUNCTION PANEL
ALT: 19 U/L (ref 0–44)
AST: 45 U/L — ABNORMAL HIGH (ref 15–41)
Albumin: 2.3 g/dL — ABNORMAL LOW (ref 3.5–5.0)
Alkaline Phosphatase: 80 U/L (ref 38–126)
Bilirubin, Direct: 0.1 mg/dL (ref 0.0–0.2)
Indirect Bilirubin: 0.6 mg/dL (ref 0.3–0.9)
Total Bilirubin: 0.7 mg/dL (ref 0.3–1.2)
Total Protein: 6.8 g/dL (ref 6.5–8.1)

## 2019-10-04 LAB — LACTIC ACID, PLASMA: Lactic Acid, Venous: 1.1 mmol/L (ref 0.5–1.9)

## 2019-10-04 LAB — BRAIN NATRIURETIC PEPTIDE: B Natriuretic Peptide: 267.7 pg/mL — ABNORMAL HIGH (ref 0.0–100.0)

## 2019-10-04 LAB — TSH: TSH: 3.378 u[IU]/mL (ref 0.350–4.500)

## 2019-10-04 LAB — TROPONIN I (HIGH SENSITIVITY): Troponin I (High Sensitivity): 984 ng/L (ref <18)

## 2019-10-04 MED ORDER — ATORVASTATIN CALCIUM 40 MG PO TABS: 40.0000 mg | ORAL_TABLET | Freq: Every day | ORAL | 6 refills | Status: AC

## 2019-10-04 MED ORDER — RIVAROXABAN 10 MG PO TABS
10.0000 mg | ORAL_TABLET | Freq: Every day | ORAL | Status: DC
  Administered 2019-10-04: 10 mg via ORAL
  Filled 2019-10-04: qty 1

## 2019-10-04 MED ORDER — CARVEDILOL 25 MG PO TABS: 25.0000 mg | ORAL_TABLET | Freq: Two times a day (BID) | ORAL | 6 refills | Status: DC

## 2019-10-04 MED ORDER — CEPHALEXIN 500 MG PO CAPS: 500.0000 mg | ORAL_CAPSULE | Freq: Two times a day (BID) | ORAL | 0 refills | Status: AC

## 2019-10-04 NOTE — TOC Initial Note (Signed)
Transition of Care [facility]) - Initial/Assessment Note    Patient Details  Name: Cheyenne [name] MRN: [id number] Date of Birth: [birth year]  Transition of Care [facility]) CM/SW Contact:    Alberteen Sam, LCSW Phone Number: 10/04/2019, 4:52 PM  Clinical Narrative:                  [facility] team consulted for home health/DME needs. CSW spoke with patient's spouse Jeneen Rinks who declines home health at this time and reports no DME needs as he states patient has a chair lyft at home, rolling walker, motorized scooter, and wheel chairs.   No discharge needs identified at this time.   Expected Discharge Plan: Home/Self Care Barriers to Discharge: No Barriers Identified   Patient Goals and CMS Choice   CMS Medicare.gov Compare Post Acute Care list provided to:: Patient Represenative (must comment)(spouse Jeneen Rinks) Choice offered to / list presented to : Spouse  Expected Discharge Plan and Services Expected Discharge Plan: Home/Self Care       Living arrangements for the past 2 months: Single Family Home Expected Discharge Date: 10/04/19                                    Prior Living Arrangements/Services Living arrangements for the past 2 months: Single Family Home Lives with:: Spouse Patient language and need for interpreter reviewed:: Yes Do you feel safe going back to the place where you live?: Yes      Need for Family Participation in Patient Care: Yes (Comment) Care giver support system in place?: Yes (comment)   Criminal Activity/Legal Involvement Pertinent to Current Situation/Hospitalization: No - Comment as needed  Activities of Daily Living Home Assistive Devices/Equipment: Walker (specify type) ADL Screening (condition at time of admission) Patient's cognitive ability adequate to safely complete daily activities?: Yes Is the patient deaf or have difficulty hearing?: No Does the patient have difficulty seeing, even when wearing glasses/contacts?: No Does the patient have  difficulty concentrating, remembering, or making decisions?: No Patient able to express need for assistance with ADLs?: No Does the patient have difficulty dressing or bathing?: No Independently performs ADLs?: No Communication: Appropriate for developmental age Dressing (OT): Appropriate for developmental age Grooming: Appropriate for developmental age Feeding: Appropriate for developmental age Bathing: Appropriate for developmental age 4: Appropriate for developmental age In/Out Bed: Appropriate for developmental age 23 in Home: Appropriate for developmental age Does the patient have difficulty walking or climbing stairs?: Yes Weakness of Legs: Both Weakness of Arms/Hands: Both  Permission Sought/Granted                  Emotional Assessment       Orientation: : Oriented to Self, Oriented to  Time, Oriented to Place, Oriented to Situation Alcohol / Substance Use: Not Applicable Psych Involvement: No (comment)  Admission diagnosis:  Sepsis ([place]) [A41.9] Patient Active Problem List   Diagnosis Date Noted  . UTI (urinary tract infection) 10/04/2019  . AMS (altered mental status) 10/04/2019  . DVT (deep venous thrombosis) ([place]) 10/04/2019  . Lung cancer ([place]) 10/04/2019  . Non-ST elevation (NSTEMI) myocardial infarction ([place]) 10/03/2019  . Sepsis ([place]) 10/03/2019  . Other reduced mobility 08/22/2019  . Postlaminectomy syndrome 07/11/2018  . Coronary artery calcification seen on CAT scan 08/30/2017  . Chronic anticoagulation 08/30/2017  . Carotid atherosclerosis, bilateral 07/24/2016  . Hypoxemia 11/17/2015  . Malignant neoplasm of upper lobe of right lung ([place]) 09/05/2015  .  Acute embolism and thrombosis of unspecified deep veins of left proximal lower extremity ([place]) 08/12/2015  . Anemia 08/12/2015  . Failure to thrive (0-17) 08/12/2015  . Major depressive disorder 08/12/2015  . DNR (do not resuscitate) 07/14/2015  . Acute deep vein thrombosis (DVT) of  femoral vein of left lower extremity ([place]) 07/08/2015  . Pulmonary embolism ([place]) 07/08/2015  . SIRS (systemic inflammatory response syndrome) ([place]) 07/07/2015  . Bladder infection, chronic 05/29/2014  . Recurrent UTI 05/23/2014  . Gastro-esophageal reflux disease without esophagitis 05/07/2014  . Nerve root pain 05/07/2014  . DDD (degenerative disc disease), lumbar 05/07/2014  . Osteoarthritis of spine with radiculopathy, lumbar region 05/07/2014  . High risk medication use 03/05/2014  . At risk for falls 03/05/2014  . COPD with emphysema Gold C 01/08/2014  . Chronic respiratory failure ([place]) 01/08/2014  . Hyperlipidemia   . Hypertensive heart disease   . PVD (peripheral vascular disease) ([place])   . Kidney cysts 04/20/2013  . 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 Coccyalgia 02/08/2013  . Opioid type dependence, abuse ([place]) 01/19/2013  . Osteoporosis 12/30/2012  . 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 Chronic pain associated with significant psychosocial dysfunction 11/09/2012  . Chronic pain syndrome 11/09/2012  . Atherosclerotic cerebrovascular disease 04/10/2012  . Back pain, chronic 04/10/2012  . H/O deep venous thrombosis 04/10/2012  . Lower urinary tract infectious disease 04/10/2012  . Personal history of other venous thrombosis and embolism 04/10/2012  . Chronic inflammatory demyelinating polyradiculoneuropathy ([place]) 03/21/2012  . Chronic inflammatory demyelinating polyneuritis ([place]) 03/21/2012  . Cerebral seizure ([place]) 03/13/2012  . S/P lumbar spine operation 02/03/2012  . Coronary artery disease 12/21/2011  . Adult hypothyroidism 12/21/2011  . Iron deficiency 09/14/2011  . Chronic kidney disease (CKD), stage III (moderate) 05/24/2011  . BP (high blood pressure) 05/24/2011   PCP:  Mateo Flow, MD Pharmacy:   [facility] [address] [place] [address] Phone: [phone] Fax: [phone]     Social Determinants  of Health (SDOH) Interventions    Readmission Risk Interventions No flowsheet data found.

## 2019-10-04 NOTE — Discharge Summary (Addendum)
Discharge Summary    Patient ID: Cheyenne [name] MRN: [id number]; DOB: 1941-01-17  Admit date: 10/03/2019 Discharge date: 10/04/2019  Primary Care Provider: Mateo Flow, MD  Primary Cardiologist: Shirlee More, MD  Primary Electrophysiologist:  None   Discharge Diagnoses    Principal Problem:   Non-ST elevation (NSTEMI) myocardial infarction [facility]) Active Problems:   Hyperlipidemia   Hypertensive heart disease   PVD (peripheral vascular disease) ([place])   Coronary artery disease   COPD with emphysema Gold C   Pulmonary embolism ([place])   SIRS (systemic inflammatory response syndrome) ([place])   Sepsis ([place])   UTI (urinary tract infection)   AMS (altered mental status)   DVT (deep venous thrombosis) ([place])   Lung cancer ([place])   Diagnostic Studies/Procedures    Cardiac Catheterization  Mid LAD lesion is 45% stenosed.  1st Mrg lesion is 70% stenosed.  Ost RCA to Prox RCA lesion is 100% stenosed.  The left ventricular systolic function is normal.  LV end diastolic pressure is normal.  The left ventricular ejection fraction is 55-65% by visual estimate.   1. Single vessel occlusive CAD with CTO of the ostial RCA at prior stent site with right to right collaterals. The RCA is small and nondominant. 2. Good LV function with inferobasal HK 3. Normal LVEDP  Plan: recommend medical therapy. I would not use right radial approach in the future.  Coronary Diagrams  Diagnostic Dominance: Left      _____________   History of Present Illness     Cheyenne [name] is a 79 y.o. 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 EMS found the patient to be febrile and  have oxygen level of 83% despite being 3L home O2 and the patient was brought to the ED. The husband reported several days of decreased appetite and then found the patient unresponsive that evening. In the ER she was found to be confused, oriented only to self. Initial BP 156/66, afebrile, not tachycardic. ABG revealed hypoxia with paO2of 56, elevated pCO2 54 with mild respiratory acidosis with pH 7.31. CXR was unremarkable. CT head was unremarkable except for evidence of prior right MCA territory infarct. CTA of the chest unremarkable except for known nodules in the right lung scarring left lung from previous lung cancer and no PE. Patient briefly required high-level oxygen at 10L on Venti mask but was weaned back down to 6L. Repeat ABG showed PaO2 89, pH 7.28, pCO2 stable at 54. Labs showed AKI with creatinine of 1.2 (baseline 0.81). Potassium slightly high at 5.3. WBC was 15.1. she was given a dose of levaquin in the ER but this was later transitioned to vancomycin and Zosyn. Lactic acid and procalcitonin were normal. CRP was slightly elevated at 30.7. She was given IVF. She was also given Rocephin x1 dose. Patient was admitted to the ICU.  First troponin was 0.96 felt to be demand ischemia. Second troponin came back at 2.54.The patient developed atypical chest pain with a pleuritic component and cardiology was consulted. EKG reviewd showed NSR 90 bpm with nonspecific ST changes. Troponin trended up to 4.66. She was started on IV heparin. She was noted to be hypertensive and was started on a NTG drip. Aspirin and Lipitor  40 mg daily were started. It was felt the elevated troponin was significant and not just demand ischemia. [facility] was consulted who agreed to accept the patient on cardiology service for possible cardiac catheterization.   Hospital Course     Consultants: None  On arrival the patient was taken back to the cath lab. Right radial access was established. Cath showed single vessel occlusive CAD  with CTO of the ostial RCA at prior stent site with right to right collaterals; the RCA was small and nondominant; good LV function with inferobasal HK, and normal LVEDP. No PCI was performed. Post cath recommendations for continued medical therapy for moderate CAD. IV heparin and IV NTG were discontinued. The patient tolerated the procedure well. Pressures were elevated and coreg 25 mg BID was added. Patient remained stable overnight. Xarelto 10 mg daily was re-started. Breathing improved and oxygen needs decreased to her normal 3L O2. At first the records from the ER were not available so labs, UA, CXR, EKG, echo were ordered. It does not appear IV abx were continued on arrival to [facility]. The patient's memory was poor and did not have good recollection of health history or medications. The husband assisted with medical history and events leading up to hospitalization. The patent denied recurrent chest pain. CXR showed no acute process. EKG showed NSR with nonspecific T wave changes. Labs showed HS troponin 984, BNP 267. WBC came back at 16.8. creatinine 1.09 and Hgb 14.5. Albumin 2.3 and AST 45. The cath site, right radial, remained stable with no signs of bleeding, hematoma, or bruit. Echo was obtained but official read was not available prior to discharge. The patient felt better and her and the husband were requesting to go home. Patient felt the patient was back to baseline. Review of the records said urine culture taken in the ED was positive for E.Coli. UA came back positive for Hgb, Nitrites, and leukocytes. Given elevated WBC/UA results will plan to discharge the patient with Keflex 500mg  BID for 7 days and recommend close follow-up with PCP. (Chart says she has rash allergy to Penicillin but patient tolerated rocephin well). Will also send in a new prescriptions for Lipitor 40 mg daily and Coreg 25 mg BID. Patient will need follow-up labs for lipids in 4-6 weeks. Continue with home losartan 100 mg daily.    The patient was evaluated by Dr. Meda Coffee on 10/04/19 and felt to be stable for discharge.    Did the patient have an acute coronary syndrome (MI, NSTEMI, STEMI, etc) this admission?:  No                               Did the patient have a percutaneous coronary intervention (stent / angioplasty)?:  No.   _____________  Discharge Vitals Blood pressure (!) 147/48, pulse 72, temperature 98.3 F (36.8 C), temperature source Oral, resp. rate 16, height 5\' 5"  (1.651 m), weight 71.4 kg, SpO2 96 %.  Filed Weights   10/03/19 1200 10/03/19 1815 10/04/19 0648  Weight: 70.3 kg 70.3 kg 71.4 kg    Labs & Radiologic Studies    CBC Recent Labs    10/03/19 1713  WBC 16.8*  HGB 14.5  HCT 45.0  MCV 85.2  PLT 756   Basic Metabolic Panel Recent Labs    10/03/19 1809 10/04/19 1222  NA  --  140  K  --  3.9  CL  --  102  CO2  --  26  GLUCOSE  --  93  BUN  --  16  CREATININE 0.97 1.09*  CALCIUM  --  9.0   Liver Function Tests Recent Labs    10/04/19 1222  AST 45*  ALT 19  ALKPHOS 80  BILITOT 0.7  PROT 6.8  ALBUMIN 2.3*   No results for input(s): LIPASE, AMYLASE in the last 72 hours. High Sensitivity Troponin:   Recent Labs  Lab 10/04/19 1222  TROPONINIHS 984*    BNP Invalid input(s): POCBNP D-Dimer No results for input(s): DDIMER in the last 72 hours. Hemoglobin A1C No results for input(s): HGBA1C in the last 72 hours. Fasting Lipid Panel No results for input(s): CHOL, HDL, LDLCALC, TRIG, CHOLHDL, LDLDIRECT in the last 72 hours. Thyroid Function Tests Recent Labs    10/04/19 1222  TSH 3.378   _____________  DG Chest 2 View  Result Date: 10/04/2019 CLINICAL DATA:  Patient with shortness of breath. EXAM: CHEST - 2 VIEW COMPARISON:  Chest radiograph 10/01/2019; CT chest 10/01/2019 FINDINGS: Stable cardiac and mediastinal contours. Monitoring leads overlie the patient. Thoracic spinal stimulator device. Stable opacity within the right upper lung. No new pulmonary  opacities. No pleural effusion or pneumothorax. Osseous structures unremarkable. IMPRESSION: No acute cardiopulmonary process. Electronically Signed   By: Lovey Newcomer M.D.   On: 10/04/2019 13:14   CARDIAC CATHETERIZATION  Result Date: 10/03/2019  Mid LAD lesion is 45% stenosed.  1st Mrg lesion is 70% stenosed.  Ost RCA to Prox RCA lesion is 100% stenosed.  The left ventricular systolic function is normal.  LV end diastolic pressure is normal.  The left ventricular ejection fraction is 55-65% by visual estimate.  1. Single vessel occlusive CAD with CTO of the ostial RCA at prior stent site with right to right collaterals. The RCA is small and nondominant. 2. Good LV function with inferobasal HK 3. Normal LVEDP Plan: recommend medical therapy. I would not use right radial approach in the future.   Disposition   Pt is being discharged home today in good condition.  Follow-up Plans & Appointments    Follow-up Information    Tobb, Godfrey Pick, DO Follow up on 10/11/2019.   Specialty: Cardiology Why: Hospital follow-up 3/18 at 10:20 AM Contact information: [place] [address] [phone]          Discharge Instructions    Call MD for:  difficulty breathing, headache or visual disturbances   Complete by: As directed    Call MD for:  hives   Complete by: As directed    Call MD for:  persistant dizziness or light-headedness   Complete by: As directed    Call MD for:  persistant nausea and vomiting   Complete by: As directed    Call MD for:  redness, tenderness, or signs of infection (pain, swelling, redness, odor or green/yellow discharge around incision site)   Complete by: As directed    Call MD for:  severe uncontrolled pain   Complete by: As directed    Diet - low sodium heart healthy   Complete by: As directed    Discharge instructions   Complete by: As directed    No driving for 3 days. No lifting over 5 lbs for 5 days. No sexual activity for 1 week. Keep  procedure site clean & dry. If you notice increased pain, swelling, bleeding or pus, call/return!  You may shower, but no soaking baths/hot tubs/pools for 1 week.   Increase activity slowly   Complete  by: As directed       Discharge Medications   Allergies as of 10/04/2019      Reactions   Asa [aspirin] Other (See Comments)   On 325mg . Per pt 81mg  does not bother her nausated   Bee Venom    Duloxetine Hcl Diarrhea   Hornet Venom Other (See Comments)   Phenergan [promethazine Hcl] Other (See Comments)   Pt "Goes Crazy" Hallucinations, "go crazy"   Penicillins Rash   Rash, itching - unsure of exact reaction   Promethazine Anxiety      Medication List    STOP taking these medications   simvastatin 20 MG tablet Commonly known as: ZOCOR     TAKE these medications   acetaminophen 325 MG tablet Commonly known as: TYLENOL Take 650 mg by mouth every 6 (six) hours as needed.   albuterol (2.5 MG/3ML) 0.083% nebulizer solution Commonly known as: PROVENTIL Take 2.5 mg by nebulization every 4 (four) hours as needed for shortness of breath.   amitriptyline 10 MG tablet Commonly known as: ELAVIL Take 10 mg by mouth daily.   atorvastatin 40 MG tablet Commonly known as: LIPITOR Take 1 tablet (40 mg total) by mouth daily at 6 PM.   Belbuca 600 MCG Film Generic drug: Buprenorphine HCl Place 1 Film inside cheek daily.   bisacodyl 5 MG EC tablet Generic drug: bisacodyl Take 5 mg by mouth every other day.   carvedilol 25 MG tablet Commonly known as: COREG Take 1 tablet (25 mg total) by mouth 2 (two) times daily with a meal.   cephALEXin 500 MG capsule Commonly known as: KEFLEX Take 1 capsule (500 mg total) by mouth 2 (two) times daily for 7 days.   Cholecalciferol 25 MCG (1000 UT) capsule Take 1,000 Units by mouth daily.   [employer] SPECTRAVITE ADULT 50+ PO Take 1 tablet by mouth daily.   DULoxetine 30 MG capsule Commonly known as: CYMBALTA Take 30 mg by mouth 2 (two) times  daily.   folic acid 1 MG tablet Commonly known as: FOLVITE Take 1 mg by mouth daily.   gabapentin 300 MG capsule Commonly known as: NEURONTIN Take 300 mg by mouth See admin instructions. 300mg  in the morning, 600mg  at noon and 300mg  at night   Hematinic/Folic Acid 979-8 MG Tabs Generic drug: Ferrous Fumarate-Folic Acid Take 1 tablet by mouth daily.   levETIRAcetam 500 MG tablet Commonly known as: KEPPRA Take 500 mg by mouth 2 (two) times daily.   levothyroxine 50 MCG tablet Commonly known as: SYNTHROID Take 50 mcg by mouth daily before breakfast.   lidocaine 5 % ointment Commonly known as: XYLOCAINE 1 application daily as needed for moderate pain.   loperamide 2 MG capsule Commonly known as: IMODIUM Take 2 mg by mouth as needed for diarrhea or loose stools.   losartan 100 MG tablet Commonly known as: COZAAR Take 50-100 mg by mouth See admin instructions. 100mg  in the morning and 50mg  at night if needed (blood pressure greater than 150)   nitroGLYCERIN 0.4 MG SL tablet Commonly known as: NITROSTAT Place 0.4 mg under the tongue every 5 (five) minutes as needed for chest pain.   omeprazole 40 MG capsule Commonly known as: PRILOSEC Take 40 mg by mouth daily.   ondansetron 8 MG tablet Commonly known as: ZOFRAN Take 8 mg by mouth every 8 (eight) hours as needed for vomiting.   OXYGEN Inhale 3 L into the lungs continuous.   rivaroxaban 10 MG Tabs tablet Commonly known as: Xarelto  Take 1 tablet (10 mg total) by mouth daily.   Spiriva Respimat 2.5 MCG/ACT Aers Generic drug: Tiotropium Bromide Monohydrate Inhale 1 puff into the lungs as needed (shortness of breath).   thiamine 100 MG tablet Commonly known as: VITAMIN B-1 Take 50 mg by mouth 2 (two) times daily.   vitamin C 500 MG tablet Commonly known as: ASCORBIC ACID Take 250 mg by mouth daily.          Outstanding Labs/Studies   None  Duration of Discharge Encounter   Greater than 30 minutes  including physician time.  Signed, Keilynn Marano Ninfa Meeker, PA-C 10/04/2019, 4:46 PM

## 2019-10-04 NOTE — Progress Notes (Signed)
Lab called at 1323 with critical troponin of 984pg/ml. Ed Blalock PA  paged with critical result.  [facility] PA Ed Blalock called back, now new orders at [facility] time. Will continue to monitor

## 2019-10-04 NOTE — Progress Notes (Addendum)
Progress Note  Patient Name: Cheyenne [name] Date of Encounter: 10/04/2019  Primary Cardiologist: Shirlee More, MD   Subjective   Patient was transferred from [facility] yesterday for CP and elevated troponin. Still awaiting records to be scanned in the chart.   Cath showed Single vessel occlusive CAD with CTO of the ostial RCA at prior stent site with right to right collaterals; no PCI performed.   Patient has not had recurrent chest pain. Is on supplemental O2 (3L at home).  Inpatient Medications    Scheduled Meds: . amitriptyline  10 mg Oral QHS  . atorvastatin  40 mg Oral q1800  . carvedilol  25 mg Oral BID WC  . cholecalciferol  1,000 Units Oral Daily  . DULoxetine  30 mg Oral BID  . folic acid  1 mg Oral Daily  . gabapentin  300 mg Oral QID  . levETIRAcetam  500 mg Oral BID  . levothyroxine  50 mcg Oral Q0600  . losartan  100 mg Oral Daily  . multivitamin with minerals   Oral Daily  . pantoprazole  40 mg Oral Daily  . rivaroxaban  10 mg Oral Daily  . sodium chloride flush  3 mL Intravenous Q12H  . thiamine  100 mg Oral BID   Continuous Infusions: . sodium chloride     PRN Meds: sodium chloride, acetaminophen, albuterol, nitroGLYCERIN, ondansetron (ZOFRAN) IV, ondansetron, sodium chloride flush   Vital Signs    Vitals:   10/04/19 0648 10/04/19 0759 10/04/19 1042 10/04/19 1131  BP: (!) 147/47 (!) 157/62 (!) 124/57 (!) 147/48  Pulse: 88 82 76 72  Resp: 16 14  16   Temp: 97.6 F (36.4 C) 97.8 F (36.6 C)  98.3 F (36.8 C)  TempSrc: Oral Oral  Oral  SpO2: 98% 98%  96%  Weight: 71.4 kg     Height:        Intake/Output Summary (Last 24 hours) at 10/04/2019 1134 Last data filed at 10/04/2019 0848 Gross per 24 hour  Intake 480 ml  Output 1050 ml  Net -570 ml   Last 3 Weights 10/04/2019 10/03/2019 10/03/2019  Weight (lbs) 157 lb 6.5 oz 155 lb 155 lb  Weight (kg) 71.4 kg 70.308 kg 70.308 kg      Telemetry    SR - Personally Reviewed  ECG    pending -  Personally Reviewed  Physical Exam   GEN: No acute distress.   Neck: No JVD Cardiac: RRR, no murmurs, rubs, or gallops.  Respiratory: Crackles; 3LO2 GI: Soft, nontender, non-distended  MS: No edema; No deformity. Neuro:  Nonfocal  Psych: Normal affect   Labs    High Sensitivity Troponin:  No results for input(s): TROPONINIHS in the last 720 hours.    Chemistry Recent Labs  Lab 10/03/19 1809  CREATININE 0.97  GFRNONAA 55*  GFRAA >60     Hematology Recent Labs  Lab 10/03/19 1713  WBC 16.8*  RBC 5.28*  HGB 14.5  HCT 45.0  MCV 85.2  MCH 27.5  MCHC 32.2  RDW 14.7  PLT 230    BNPNo results for input(s): BNP, PROBNP in the last 168 hours.   DDimer No results for input(s): DDIMER in the last 168 hours.   Radiology    CARDIAC CATHETERIZATION  Result Date: 10/03/2019  Mid LAD lesion is 45% stenosed.  1st Mrg lesion is 70% stenosed.  Ost RCA to Prox RCA lesion is 100% stenosed.  The left ventricular systolic function is normal.  LV end diastolic  pressure is normal.  The left ventricular ejection fraction is 55-65% by visual estimate.  1. Single vessel occlusive CAD with CTO of the ostial RCA at prior stent site with right to right collaterals. The RCA is small and nondominant. 2. Good LV function with inferobasal HK 3. Normal LVEDP Plan: recommend medical therapy. I would not use right radial approach in the future.    Cardiac Studies    Mid LAD lesion is 45% stenosed.  1st Mrg lesion is 70% stenosed.  Ost RCA to Prox RCA lesion is 100% stenosed.  The left ventricular systolic function is normal.  LV end diastolic pressure is normal.  The left ventricular ejection fraction is 55-65% by visual estimate.   1. Single vessel occlusive CAD with CTO of the ostial RCA at prior stent site with right to right collaterals. The RCA is small and nondominant. 2. Good LV function with inferobasal HK 3. Normal LVEDP  Plan: recommend medical therapy. I would not use  right radial approach in the future.  Coronary Diagrams  Diagnostic Dominance: Left     Patient Profile     79 y.o. female with pmh of CAD s/p PCI and vision stent to ostial RCA and PTCA of DI in 2010, HTN, HLD, GERD, hypothyroidism, COPD on 3L O2, chronic UTI,, PVD, ministroke in 2005, Lung cancer s/p radiation, DVT associated with lung cancer on Xarelto who was transferred from [facility] for NSTEMI. Sunday night the patient was unresponsive and EMS was called.   Assessment & Plan    NSTEMI/CAD s/p PCI in 2010 - cath showed single vessel occlusive CAD with CTO of the ostial RCA at prior stent site with right to right collaterals; the RCA is small and nondominant; normal EF with inferobasal HK; normal LVEDP - Recommend medical therapy - Coreg 25 mg BID added - will check an echo - Resume home Xarelto for h/o of DVT - Right wrist cath site is stable - NO further chest pain - BMET ordered - check TSH  HTN - continue home losartan.  - Coreg 25 mg BID was added  HLD - was on simvastatin at baseline>>changed to Atorvastatin 40 mg - LDL 35 08/23/19  ?Urosepsis - check UA and CBC - patient's husband reports chronic UTIs  ?PNA - CXR ordered  Venous thrombosis associated with cancer - was on long-term anticoagulation with Xarelto 10 mg daily at home  For questions or updates, please contact [place] Please consult [email or web address] for contact info under      Signed, Damario Gillie Ninfa Meeker, PA-C  10/04/2019, 11:34 AM    The patient was seen, examined and discussed with Sharonne Ricketts Ninfa Meeker, PA-C   and I agree with the above.   The patient was transferred from [facility] yesterday for evaluation of non-STEMI, cardiac catheterization showed Mid LAD lesion is 45% stenosed, 1st Mrg lesion is 70% stenosed, Ost RCA to Prox RCA lesion is 100% stenosed, The left ventricular systolic function is normal, LV end diastolic pressure is normal, The left ventricular ejection  fraction is 55-65% by visual estimate.  Dr. Martinique concluded: Single vessel occlusive CAD with CTO of the ostial RCA at prior stent site with right to right collaterals. The RCA is small and nondominant with plan for medical therapy.  Her creatinine post cath yesterday is stable and normal, will obtain the residual labs, she is chest pain-free, we will obtain an echocardiogram since we do not have any here.  Her vitals are stable, we will add  aspirin and Plavix, continue high-dose atorvastatin carvedilol and losartan.  We will repeat labs including urinalysis for potential urosepsis.  Ena Dawley, MD 10/04/2019

## 2019-10-04 NOTE — Progress Notes (Signed)
  Echocardiogram 2D Echocardiogram has been performed.  Cheyenne [name] 10/04/2019, 3:56 PM

## 2019-10-04 NOTE — Progress Notes (Addendum)
Per RN: Patient unable to provide urine sample, bladder scan showing 699ml. Straight cath to obtained urine sample as patient is unable to void. 700 ml of amber urine obtained. Patient's husband verbalized patient has a chronic problem emptying bladder and patient's husband is concern that retention could have contributed to UTI. Patient's husband would like a referral to Urology.   Spoke with RN regarding the situation. As it is after 5:00, I am not able to get permission from Dr. Bettina Gavia to make a referral.   Therefore, I am not able to refer her to a urologist in [place].  I have requested the RN let Mr. Bainter know that he may be able to self-refer her to a urologist in [place], this depends on his insurance. His insurance may or may not allow specialist to make referrals to other specialist.  In some cases, only his PCP can do with the referral.   I will route this note to Dr. Humphrey Rolls.  We are discharging her on Keflex for UTI.  Rosaria Ferries, PA-C 10/04/2019 5:56 PM

## 2019-10-09 LAB — CULTURE, BLOOD (ROUTINE X 2)
Culture: NO GROWTH
Culture: NO GROWTH
Special Requests: ADEQUATE

## 2019-10-11 ENCOUNTER — Ambulatory Visit (INDEPENDENT_AMBULATORY_CARE_PROVIDER_SITE_OTHER): Payer: Medicare Other | Admitting: Cardiology

## 2019-10-11 ENCOUNTER — Encounter: Payer: Self-pay | Admitting: Cardiology

## 2019-10-11 ENCOUNTER — Other Ambulatory Visit: Payer: Self-pay

## 2019-10-11 VITALS — BP 80/54 | HR 63 | Ht 65.0 in

## 2019-10-11 DIAGNOSIS — I739 Peripheral vascular disease, unspecified: Secondary | ICD-10-CM | POA: Diagnosis not present

## 2019-10-11 DIAGNOSIS — I119 Hypertensive heart disease without heart failure: Secondary | ICD-10-CM

## 2019-10-11 DIAGNOSIS — I2510 Atherosclerotic heart disease of native coronary artery without angina pectoris: Secondary | ICD-10-CM | POA: Diagnosis not present

## 2019-10-11 DIAGNOSIS — N183 Chronic kidney disease, stage 3 unspecified: Secondary | ICD-10-CM

## 2019-10-11 DIAGNOSIS — E785 Hyperlipidemia, unspecified: Secondary | ICD-10-CM

## 2019-10-11 DIAGNOSIS — I959 Hypotension, unspecified: Secondary | ICD-10-CM | POA: Diagnosis not present

## 2019-10-11 DIAGNOSIS — Z7901 Long term (current) use of anticoagulants: Secondary | ICD-10-CM

## 2019-10-11 MED ORDER — LOSARTAN POTASSIUM 50 MG PO TABS: 50.0000 mg | ORAL_TABLET | ORAL | 1 refills | Status: AC

## 2019-10-11 MED ORDER — CARVEDILOL 12.5 MG PO TABS: 12.5000 mg | ORAL_TABLET | Freq: Two times a day (BID) | ORAL | 1 refills | Status: AC

## 2019-10-11 NOTE — Patient Instructions (Addendum)
Medication Instructions:  Your physician has recommended you make the following change in your medication:  Decrease: Carvedilol 12.5 mg twice daily    Decrease: Losartan to 50 mg daily (if systolic blood pressure is higher than 150 tke a extra 50 mg)   *If you need a refill on your cardiac medications before your next appointment, please call your pharmacy*   Lab Work: None.  If you have labs (blood work) drawn today and your tests are completely normal, you will receive your results only by: Marland Kitchen MyChart Message (if you have MyChart) OR . A paper copy in the mail If you have any lab test that is abnormal or we need to change your treatment, we will call you to review the results.   Testing/Procedures: None.    Follow-Up: At [facility], you and your health needs are our priority.  As part of our continuing mission to provide you with exceptional heart care, we have created designated Provider Care Teams.  These Care Teams include your primary Cardiologist (physician) and Advanced Practice Providers (APPs -  Physician Assistants and Nurse Practitioners) who all work together to provide you with the care you need, when you need it.  We recommend signing up for the patient portal called "MyChart".  Sign up information is provided on this After Visit Summary.  MyChart is used to connect with patients for Virtual Visits (Telemedicine).  Patients are able to view lab/test results, encounter notes, upcoming appointments, etc.  Non-urgent messages can be sent to your provider as well.   To learn more about what you can do with MyChart, go to [email or web address].    Your next appointment:   2 week(s)  The format for your next appointment:   In Person  Provider:   Shirlee More, MD   Other Instructions

## 2019-10-11 NOTE — Progress Notes (Signed)
Cardiology Office Note:    Date:  10/11/2019   ID:  [name], DOB 1941/06/29, MRN [id number]  PCP:  Mateo Flow, MD  Cardiologist:  Shirlee More, MD  Electrophysiologist:  None   Referring MD: Mateo Flow, MD   Chief Complaint  Patient presents with  . Hospitalization Follow-up   History of Present Illness:    Cheyenne [name] is a 79 y.o. 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   The patient was hospitalized at [facility] for NSTEMI and acute on chronic respiratory failure.  Patient was transferred to [facility] for a cardiac catheterization.  Low-grade catheterization but no PCI.  She is here today for follow-up visit.  Past Medical History:  Diagnosis Date  . Acute deep vein thrombosis (DVT) of femoral vein of left lower extremity ([place]) 07/08/2015  . Acute embolism and thrombosis of unspecified deep veins of left proximal lower extremity ([place]) 08/12/2015  . Acute pulmonary embolism ([place]) 07/08/2015  . Adult hypothyroidism 12/21/2011  . Anemia 08/12/2015  . 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 Arteriosclerosis of coronary artery 12/21/2011   Overview:  2 vessel s/p PCI and Vision stent to ostial RCA and PTCA of DI (02/2009).  Lexiscan MPS (10/20/11) revealed normal perfusion and function, EF 66%.  Last Assessment & Plan:  Relevant Hx: Course: Daily Update: Today's Plan:   . At risk for falls 03/05/2014  . Atherosclerotic cerebrovascular disease 04/10/2012  . Back pain, chronic 04/10/2012  . Bladder infection, chronic 05/29/2014  . BP (high blood pressure) 05/24/2011  . Carotid atherosclerosis, bilateral 07/24/2016  . Cerebral seizure ([place]) 03/13/2012  . 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 Chronic kidney disease (CKD), stage III (moderate) 05/24/2011  . Chronic pain associated with significant psychosocial dysfunction 11/09/2012  . Chronic pain syndrome 11/09/2012  . Chronic respiratory failure ([place]) 01/08/2014  . Coccyalgia 02/08/2013  . 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 COPD with emphysema Gold C 01/08/2014   Cheyenne [name] 01/08/2014: FeV1 52% FVC 68% FeV1/FVC 57%    CT Chest 11/2013: emphysematous changes 06/17/2014 CT Chest: no nodule seen, emphysema only    . Coronary artery calcification seen on CAT scan 08/30/2017  . Coronary artery disease 12/21/2011   Overview:  2 vessel s/p PCI and Vision stent to ostial RCA and PTCA of DI (02/2009).  Lexiscan MPS (10/20/11) revealed normal perfusion and function, EF 66%.  Last Assessment & Plan:  Relevant Hx: Course: Daily Update: Today's Plan:   . DDD (degenerative disc disease), lumbar 05/07/2014  . DNR (do not resuscitate) 07/14/2015  . DVT (deep venous thrombosis) ([place]) 10/04/2019  . Failure to thrive (0-17) 08/12/2015  . Gastro-esophageal reflux disease without esophagitis 05/07/2014  . H/O deep venous thrombosis 04/10/2012   Overview:  postoperative Overview:  postoperative   . High risk medication use 03/05/2014  . Hyperlipidemia   . Hypertensive heart disease   . Hypoxemia 11/17/2015  . Iron deficiency 09/14/2011  . Kidney cysts 04/20/2013  . Lower urinary tract infectious disease 04/10/2012  . Major depressive disorder 08/12/2015  . Malignant neoplasm of upper lobe of right lung ([place]) 09/05/2015  . Multi-vessel coronary artery stenosis   . Nerve root pain 05/07/2014  . Non-ST elevation (NSTEMI) myocardial infarction ([place]) 10/03/2019  .  Opioid type dependence, abuse ([place]) 01/19/2013  . Osteoarthritis of spine with radiculopathy, lumbar region 05/07/2014  . Osteoporosis 12/30/2012  . Personal history of other venous thrombosis and embolism 04/10/2012   Overview:  Overview:  Overview:   postoperative Overview:  postoperative Overview:  postoperative  . Pulmonary embolism ([place]) 07/08/2015  . PVD (peripheral vascular disease) ([place])   . Recurrent UTI 05/23/2014  . S/P lumbar spine operation 02/03/2012  . Seizure disorder ([place])   . SIRS (systemic inflammatory response syndrome) ([place]) 07/07/2015  . Spondylosis, lumbosacral   . Stricture of artery [facility])     Past Surgical History:  Procedure Laterality Date  . APPENDECTOMY    . BACK SURGERY    . JOINT REPLACEMENT    . LEFT HEART CATH AND CORONARY ANGIOGRAPHY N/A 10/03/2019   Procedure: LEFT HEART CATH AND CORONARY ANGIOGRAPHY;  Surgeon: Martinique, Peter M, MD;  Location: [place] [facility];  Service: Cardiovascular;  Laterality: N/A;  . SPINAL CORD STIMULATOR IMPLANT  2010  . stent  1990  . VESICOVAGINAL FISTULA CLOSURE W/ TAH  1962    Current Medications: Current Meds  Medication Sig  . acetaminophen (TYLENOL) 325 MG tablet Take 650 mg by mouth every 6 (six) hours as needed.  Marland Kitchen albuterol (PROVENTIL) (2.5 MG/3ML) 0.083% nebulizer solution Take 2.5 mg by nebulization every 4 (four) hours as needed for shortness of breath.   Marland Kitchen amitriptyline (ELAVIL) 10 MG tablet Take 10 mg by mouth daily.  Marland Kitchen atorvastatin (LIPITOR) 40 MG tablet Take 1 tablet (40 mg total) by mouth daily at 6 PM.  . bisacodyl (BISACODYL) 5 MG EC tablet Take 5 mg by mouth every other day.  . Buprenorphine HCl (BELBUCA) 600 MCG FILM Place 1 Film inside cheek daily.   . carvedilol (COREG) 25 MG tablet Take 1 tablet (25 mg total) by mouth 2 (two) times daily with a meal.  . cephALEXin (KEFLEX) 500 MG capsule Take 1 capsule (500 mg total) by mouth 2 (two) times daily for 7 days.  . Cholecalciferol 1000 UNITS capsule Take 1,000 Units by mouth daily.  . DULoxetine (CYMBALTA) 30 MG capsule Take 30 mg by mouth 2 (two) times daily.  . folic acid (FOLVITE) 1 MG tablet Take 1 mg by mouth daily.  Marland Kitchen gabapentin (NEURONTIN) 300 MG capsule Take 300 mg by mouth See admin  instructions. 300mg  in the morning, 600mg  at noon and 300mg  at night  . HEMATINIC/FOLIC ACID 536-1 MG TABS Take 1 tablet by mouth daily.  Marland Kitchen levETIRAcetam (KEPPRA) 500 MG tablet Take 500 mg by mouth 2 (two) times daily.  Marland Kitchen levothyroxine (SYNTHROID, LEVOTHROID) 50 MCG tablet Take 50 mcg by mouth daily before breakfast.  . lidocaine (XYLOCAINE) 5 % ointment 1 application daily as needed for moderate pain.   Marland Kitchen loperamide (IMODIUM) 2 MG capsule Take 2 mg by mouth as needed for diarrhea or loose stools.  Marland Kitchen losartan (COZAAR) 100 MG tablet Take 50-100 mg by mouth See admin instructions. 100mg  in the morning and 50mg  at night if needed (blood pressure greater than 150)  . Multiple Vitamins-Minerals ([employer] SPECTRAVITE ADULT 50+ PO) Take 1 tablet by mouth daily.  . nitroGLYCERIN (NITROSTAT) 0.4 MG SL tablet Place 0.4 mg under the tongue every 5 (five) minutes as needed for chest pain.  Marland Kitchen omeprazole (PRILOSEC) 40 MG capsule Take 40 mg by mouth daily.   . ondansetron (ZOFRAN) 8 MG tablet Take 8 mg by mouth every 8 (eight) hours as needed for vomiting.   Marland Kitchen  OXYGEN Inhale 3 L into the lungs continuous.   . rivaroxaban (XARELTO) 10 MG TABS tablet Take 1 tablet (10 mg total) by mouth daily.  Marland Kitchen thiamine (VITAMIN B-1) 100 MG tablet Take 50 mg by mouth 2 (two) times daily.   . Tiotropium Bromide Monohydrate (SPIRIVA RESPIMAT) 2.5 MCG/ACT AERS Inhale 1 puff into the lungs as needed (shortness of breath).   . vitamin C (ASCORBIC ACID) 500 MG tablet Take 250 mg by mouth daily.     Allergies:   Asa [aspirin], Bee venom, Duloxetine hcl, Hornet venom, Phenergan [promethazine hcl], Penicillins, and Promethazine   Social History   Socioeconomic History  . Marital status: Single    Spouse name: Not on file  . Number of children: Not on file  . Years of education: Not on file  . Highest education level: Not on file  Occupational History  . Occupation: Retired    Comment: [employer]  Tobacco Use  . Smoking status:  Former Smoker    Packs/day: 1.50    Years: 12.00    Pack years: 18.00    Types: Cigarettes    Quit date: 07/27/1995    Years since quitting: 24.2  . Smokeless tobacco: Never Used  Substance and Sexual Activity  . Alcohol use: No  . Drug use: No  . Sexual activity: Not on file  Other Topics Concern  . Not on file  Social History Narrative  . Not on file   Social Determinants of Health   Financial Resource Strain:   . Difficulty of Paying Living Expenses:   Food Insecurity:   . Worried About Charity fundraiser in the Last Year:   . Arboriculturist in the Last Year:   Transportation Needs:   . Film/video editor (Medical):   Marland Kitchen Lack of Transportation (Non-Medical):   Physical Activity:   . Days of Exercise per Week:   . Minutes of Exercise per Session:   Stress:   . Feeling of Stress :   Social Connections:   . Frequency of Communication with Friends and Family:   . Frequency of Social Gatherings with Friends and Family:   . Attends Religious Services:   . Active Member of Clubs or Organizations:   . Attends Archivist Meetings:   Marland Kitchen Marital Status:      Family History: The patient's family history includes Heart disease in her brother, father, and mother.  ROS:   Review of Systems  Constitution: Negative for decreased appetite, fever and weight gain.  HENT: Negative for congestion, ear discharge, hoarse voice and sore throat.   Eyes: Negative for discharge, redness, vision loss in right eye and visual halos.  Cardiovascular: Negative for chest pain, dyspnea on exertion, leg swelling, orthopnea and palpitations.  Respiratory: Negative for cough, hemoptysis, shortness of breath and snoring.   Endocrine: Negative for heat intolerance and polyphagia.  Hematologic/Lymphatic: Negative for bleeding problem. Does not bruise/bleed easily.  Skin: Negative for flushing, nail changes, rash and suspicious lesions.  Musculoskeletal: Negative for arthritis, joint pain,  muscle cramps, myalgias, neck pain and stiffness.  Gastrointestinal: Negative for abdominal pain, bowel incontinence, diarrhea and excessive appetite.  Genitourinary: Negative for decreased libido, genital sores and incomplete emptying.  Neurological: Negative for brief paralysis, focal weakness, headaches and loss of balance.  Psychiatric/Behavioral: Negative for altered mental status, depression and suicidal ideas.  Allergic/Immunologic: Negative for HIV exposure and persistent infections.    EKGs/Labs/Other Studies Reviewed:    The following studies  were reviewed today:   EKG:  The ekg ordered today demonstrates   [facility] 10/03/19 Mid LAD lesion is 45% stenosed.  1st Mrg lesion is 70% stenosed.  Ost RCA to Prox RCA lesion is 100% stenosed.  The left ventricular systolic function is normal.  LV end diastolic pressure is normal.  The left ventricular ejection fraction is 55-65% by visual estimate.   1. Single vessel occlusive CAD with CTO of the ostial RCA at prior stent site with right to right collaterals. The RCA is small and nondominant. 2. Good LV function with inferobasal HK 3. Normal LVEDP Plan: recommend medical therapy. I would not use right radial approach in the future.  TTE  1. Left ventricular ejection fraction, by estimation, is 50 to 55%. The  left ventricle has low normal function. The left ventricle demonstrates  regional wall motion abnormalities (see scoring diagram/findings for  description). There is mild concentric  left ventricular hypertrophy. Left ventricular diastolic parameters are  consistent with Grade I diastolic dysfunction (impaired relaxation).  Elevated left ventricular end-diastolic pressure.  2. Right ventricular systolic function is normal. The right ventricular  size is normal. There is mildly elevated pulmonary artery systolic  pressure.  3. The mitral valve is normal in structure. No evidence of mitral valve  regurgitation. No  evidence of mitral stenosis.  4. The aortic valve is normal in structure. Aortic valve regurgitation is  mild. Mild aortic valve stenosis.  5. The inferior vena cava is normal in size with greater than 50%  respiratory variability, suggesting right atrial pressure of 3 mmHg.   FINDINGS  Left Ventricle: Mild hypokinesis of the basal to mid inferior myocardium.  Left ventricular ejection fraction, by estimation, is 50 to 55%. The left  ventricle has low normal function. The left ventricle demonstrates  regional wall motion abnormalities.  The left ventricular internal cavity size was normal in size. There is  mild concentric left ventricular hypertrophy. Left ventricular diastolic  parameters are consistent with Grade I diastolic dysfunction (impaired  relaxation). Elevated left ventricular  end-diastolic pressure.   Right Ventricle: The right ventricular size is normal. No increase in  right ventricular wall thickness. Right ventricular systolic function is  normal. There is mildly elevated pulmonary artery systolic pressure. The  tricuspid regurgitant velocity is 2.05  m/s, and with an assumed right atrial pressure of 15 mmHg, the estimated  right ventricular systolic pressure is 95.2 mmHg.   Left Atrium: Left atrial size was normal in size.   Right Atrium: Right atrial size was normal in size.   Pericardium: There is no evidence of pericardial effusion.   Mitral Valve: The mitral valve is normal in structure. Normal mobility of  the mitral valve leaflets. No evidence of mitral valve regurgitation. No  evidence of mitral valve stenosis. MV peak gradient, 4.9 mmHg. The mean  mitral valve gradient is 2.0 mmHg.   Tricuspid Valve: The tricuspid valve is normal in structure. Tricuspid  valve regurgitation is trivial. No evidence of tricuspid stenosis.   Aortic Valve: The aortic valve is normal in structure.. There is mild  thickening and mild calcification of the aortic valve.  Aortic valve  regurgitation is mild. Aortic regurgitation PHT measures 457 msec. Mild  aortic stenosis is present. There is mild  thickening of the aortic valve. There is mild calcification of the aortic  valve. Aortic valve mean gradient measures 10.7 mmHg. Aortic valve peak  gradient measures 19.9 mmHg.   Pulmonic Valve: The pulmonic valve was normal  in structure. Pulmonic valve  regurgitation is not visualized. No evidence of pulmonic stenosis.   Aorta: The aortic root is normal in size and structure.   Venous: The inferior vena cava is normal in size with greater than 50%  respiratory variability, suggesting right atrial pressure of 3 mmHg.   IAS/Shunts: No atrial level shunt detected by color flow Doppler.   Recent Labs: 10/03/2019: Hemoglobin 14.5; Platelets 230 10/04/2019: ALT 19; B Natriuretic Peptide 267.7; BUN 16; Creatinine, Ser 1.09; Potassium 3.9; Sodium 140; TSH 3.378  Recent Lipid Panel    Component Value Date/Time   CHOL 114 08/23/2019 1502   TRIG 112 08/23/2019 1502   HDL 59 08/23/2019 1502   CHOLHDL 1.9 08/23/2019 1502   LDLCALC 35 08/23/2019 1502    Physical Exam:    VS:  BP (!) 80/54 (BP Location: Left Arm, Patient Position: Sitting, Cuff Size: Normal)   Pulse 63   Ht 5\' 5"  (1.651 m)   SpO2 91%   BMI 26.19 kg/m     Wt Readings from Last 3 Encounters:  10/04/19 157 lb 6.5 oz (71.4 kg)  08/23/19 160 lb (72.6 kg)  08/31/18 169 lb 6 oz (76.8 kg)     GEN: Well nourished, well developed in no acute distress HEENT: Normal NECK: No JVD; No carotid bruits LYMPHATICS: No lymphadenopathy CARDIAC: S1S2 noted,RRR, no murmurs, rubs, gallops RESPIRATORY:  Clear to auscultation without rales, wheezing or rhonchi  ABDOMEN: Soft, non-tender, non-distended, +bowel sounds, no guarding. EXTREMITIES: No edema, No cyanosis, no clubbing MUSCULOSKELETAL:  No deformity  SKIN: Warm and dry NEUROLOGIC:  Alert and oriented x 3, non-focal PSYCHIATRIC:  Normal affect, good  insight  ASSESSMENT:    1. Hypotensive episode   2. Coronary artery disease involving native coronary artery of native heart without angina pectoris   3. Hypertensive heart disease without heart failure   4. PVD (peripheral vascular disease) (HCC)   5. Stage 3 chronic kidney disease, unspecified whether stage 3a or 3b CKD   6. Hyperlipidemia, unspecified hyperlipidemia type   7. Chronic anticoagulation    PLAN:     She is hyportensive in the office today. Her blood pressure manually taken by me 80/54 mmhg- she is not symptomatic. Her  Husband reports that she has had low po intake recently. I have encouraged he to increase her po intake. In the meantime, I will decrease her Coreg to 12.5 mg twice daily, Her losartan will also be decreased to 50 mg daily.  I have advised her husband that he continue to take her blood pressure at home. If her blood pressure does not increase with the decrease dose in the next few days to notify the office. Also if she develops any lightheadedness or dizziness to take her to the ED.   CAD  Continue patient on current regimen.  Chronic anticoagulation - continue xeralto   The patient is in agreement with the above plan. The patient left the office in stable condition.  The patient will follow up in 2 weeks for follow up on medication change - the patient follows with Dr. Bettina Gavia.   Medication Adjustments/Labs and Tests Ordered: Current medicines are reviewed at length with the patient today.  Concerns regarding medicines are outlined above.  No orders of the defined types were placed in this encounter.  No orders of the defined types were placed in this encounter.   There are no Patient Instructions on file for this visit.   Adopting a Healthy Lifestyle.  Know  what a healthy weight is for you (roughly BMI <25) and aim to maintain this   Aim for 7+ servings of fruits and vegetables daily   65-80+ fluid ounces of water or unsweet tea for healthy  kidneys   Limit to max 1 drink of alcohol per day; avoid smoking/tobacco   Limit animal fats in diet for cholesterol and heart health - choose grass fed whenever available   Avoid highly processed foods, and foods high in saturated/trans fats   Aim for low stress - take time to unwind and care for your mental health   Aim for 150 min of moderate intensity exercise weekly for heart health, and weights twice weekly for bone health   Aim for 7-9 hours of sleep daily   When it comes to diets, agreement about the perfect plan isnt easy to find, even among the experts. Experts at the [place] developed an idea known as the Healthy Eating Plate. Just imagine a plate divided into logical, healthy portions.   The emphasis is on diet quality:   Load up on vegetables and fruits - one-half of your plate: Aim for color and variety, and remember that potatoes dont count.   Go for whole grains - one-quarter of your plate: Whole wheat, barley, wheat berries, quinoa, oats, brown rice, and foods made with them. If you want pasta, go with whole wheat pasta.   Protein power - one-quarter of your plate: Fish, chicken, beans, and nuts are all healthy, versatile protein sources. Limit red meat.   The diet, however, does go beyond the plate, offering a few other suggestions.   Use healthy plant oils, such as olive, canola, soy, corn, sunflower and peanut. Check the labels, and avoid partially hydrogenated oil, which have unhealthy trans fats.   If youre thirsty, drink water. Coffee and tea are good in moderation, but skip sugary drinks and limit milk and dairy products to one or two daily servings.   The type of carbohydrate in the diet is more important than the amount. Some sources of carbohydrates, such as vegetables, fruits, whole grains, and beans-are healthier than others.   Finally, stay active  Signed, Berniece Salines, DO  10/11/2019 11:22 AM    Cheyenne [name]

## 2019-10-14 ENCOUNTER — Telehealth: Payer: Self-pay | Admitting: Physician Assistant

## 2019-10-14 NOTE — Telephone Encounter (Signed)
Husband called because pt has been lethargic since her office visit w/ Dr Harriet Masson 03/18 Her BP was low in the office, 25K systolic Her Coreg 25 mg YHC>>62.3 mg bid Losartan 100 mg>>50 mg qd  Her SBP has been 112 or greater since then, but she is still unsteady on her feet.   She has had multiple UTI's, was discharged on antibiotics when she left the hospital on 3/11.  There was concern for urinary retention contributing to the UTIs, she has an appointment with the urologist coming up but has not seen them yet.  Her husband states that her urine has been pink at times.  I advised Mr. Dix that it would be okay to hold the losartan for now.  I suggested strongly that if he has any feeling that she may have developed another UTI, he needs to take her somewhere soon as possible for evaluation and treatment.  He understands and will keep a close eye on her.  Rosaria Ferries, PA-C 10/14/2019 1:19 PM

## 2019-10-25 ENCOUNTER — Other Ambulatory Visit: Payer: Self-pay

## 2019-10-25 ENCOUNTER — Ambulatory Visit (INDEPENDENT_AMBULATORY_CARE_PROVIDER_SITE_OTHER): Payer: Medicare Other | Admitting: Cardiology

## 2019-10-25 ENCOUNTER — Encounter: Payer: Self-pay | Admitting: Cardiology

## 2019-10-25 VITALS — BP 120/62 | HR 83 | Ht 65.0 in | Wt 150.0 lb

## 2019-10-25 DIAGNOSIS — I119 Hypertensive heart disease without heart failure: Secondary | ICD-10-CM

## 2019-10-25 DIAGNOSIS — I2510 Atherosclerotic heart disease of native coronary artery without angina pectoris: Secondary | ICD-10-CM | POA: Diagnosis not present

## 2019-10-25 DIAGNOSIS — E785 Hyperlipidemia, unspecified: Secondary | ICD-10-CM | POA: Diagnosis not present

## 2019-10-25 DIAGNOSIS — Z7901 Long term (current) use of anticoagulants: Secondary | ICD-10-CM

## 2019-10-25 DIAGNOSIS — I959 Hypotension, unspecified: Secondary | ICD-10-CM | POA: Diagnosis not present

## 2019-10-25 MED ORDER — NITROGLYCERIN 0.4 MG SL SUBL: 0.4000 mg | SUBLINGUAL_TABLET | SUBLINGUAL | 3 refills | Status: AC | PRN

## 2019-10-25 NOTE — Progress Notes (Signed)
Cardiology Office Note:    Date:  10/25/2019   ID:  Cheyenne [name], DOB 01-Dec-1940, MRN [id number]  PCP:  Cheyenne Flow, MD  Cardiologist:  Cheyenne More, MD    Referring MD: Cheyenne Flow, MD    ASSESSMENT:    1. Coronary artery disease involving native coronary artery of native heart without angina pectoris   2. Hypertensive heart disease without heart failure   3. Hypotensive episode   4. Hyperlipidemia, unspecified hyperlipidemia type   5. Chronic anticoagulation    PLAN:    In order of problems listed above:  1. Stable CAD continue medical therapy having no anginal discomfort 2. BP improved no further hypotension continue current beta-blocker ARB 3. Resolved 4. Continue her statin 5. Continue anticoagulant   Next appointment: 6 months   Medication Adjustments/Labs and Tests Ordered: Current medicines are reviewed at length with the patient today.  Concerns regarding medicines are outlined above.  No orders of the defined types were placed in this encounter.  No orders of the defined types were placed in this encounter.   No chief complaint on file.   History of Present Illness:    Cheyenne [name] is a 79 y.o. 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  Following recent admission to [facility] health respiratory failure she underwent left  heart catheterization showing chronic occlusion of the right coronary artery normal left ventricular function he was advised medical therapy.  She was last seen 10/11/2019 with hypotension with adjustment of medications. Compliance with diet, lifestyle and medications: Yes  10/04/2019: Cardiogram shows normal ejection fraction 1. Left ventricular ejection fraction, by estimation, is 50 to 55%. The  left ventricle has  low normal function. The left ventricle demonstrates  regional wall motion abnormalities (see scoring diagram/findings for  description). There is mild concentric  left ventricular hypertrophy. Left ventricular diastolic parameters are  consistent with Grade I diastolic dysfunction (impaired relaxation).  Elevated left ventricular end-diastolic pressure.  2. Right ventricular systolic function is normal. The right ventricular  size is normal. There is mildly elevated pulmonary artery systolic  pressure.  3. The mitral valve is normal in structure. No evidence of mitral valve  regurgitation. No evidence of mitral stenosis.  4. The aortic valve is normal in structure. Aortic valve regurgitation is  mild. Mild aortic valve stenosis.  5. The inferior vena cava is normal in size with greater than 50%  respiratory variability, suggesting right atrial pressure of 3 mmHg.   Has had no further hypotension with dose reduction of drugs M) tricyclic antidepressant.  Generally is feeling good.  No chest pain edema [name] or syncope.  Initial sat was 83% but her oxygen was not arousable.  I noticed she is 91 to 93%. Past Medical History:  Diagnosis Date  . Acute deep vein thrombosis (DVT) of femoral vein of left lower extremity ([place]) 07/08/2015  . Acute embolism and thrombosis of unspecified deep veins of left proximal lower extremity ([place]) 08/12/2015  . Acute pulmonary embolism ([place]) 07/08/2015  . Adult hypothyroidism 12/21/2011  . Anemia 08/12/2015  . 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 Arteriosclerosis of coronary artery 12/21/2011   Overview:  2 vessel s/p PCI and Vision stent to ostial RCA and PTCA of DI (02/2009).  Lexiscan MPS (10/20/11) revealed normal perfusion and  function, EF 66%.  Last Assessment & Plan:  Relevant Hx: Course: Daily Update: Today's Plan:   . At risk for falls 03/05/2014  . Atherosclerotic cerebrovascular disease 04/10/2012  . Back pain, chronic  04/10/2012  . Bladder infection, chronic 05/29/2014  . BP (high blood pressure) 05/24/2011  . Carotid atherosclerosis, bilateral 07/24/2016  . Cerebral seizure ([place]) 03/13/2012  . 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 Chronic kidney disease (CKD), stage III (moderate) 05/24/2011  . Chronic pain associated with significant psychosocial dysfunction 11/09/2012  . Chronic pain syndrome 11/09/2012  . Chronic respiratory failure ([place]) 01/08/2014  . Coccyalgia 02/08/2013  . 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 COPD with emphysema Gold C 01/08/2014   Cheyenne [name] 01/08/2014: FeV1 52% FVC 68% FeV1/FVC 57%    CT Chest 11/2013: emphysematous changes 06/17/2014 CT Chest: no nodule seen, emphysema only    . Coronary artery calcification seen on CAT scan 08/30/2017  . Coronary artery disease 12/21/2011   Overview:  2 vessel s/p PCI and Vision stent to ostial RCA and PTCA of DI (02/2009).  Lexiscan MPS (10/20/11) revealed normal perfusion and function, EF 66%.  Last Assessment & Plan:  Relevant Hx: Course: Daily Update: Today's Plan:   . DDD (degenerative disc disease), lumbar 05/07/2014  . DNR (do not resuscitate) 07/14/2015  . DVT (deep venous thrombosis) ([place]) 10/04/2019  . Failure to thrive (0-17) 08/12/2015  . Gastro-esophageal reflux disease without esophagitis 05/07/2014  . H/O deep venous thrombosis 04/10/2012   Overview:  postoperative Overview:  postoperative   . High risk medication use 03/05/2014  . Hyperlipidemia   . Hypertensive heart disease   . Hypoxemia 11/17/2015  . Iron deficiency 09/14/2011  . Kidney cysts 04/20/2013  . Lower urinary tract infectious disease 04/10/2012  . Major depressive disorder 08/12/2015  . Malignant neoplasm of upper lobe of right lung ([place]) 09/05/2015  . Multi-vessel coronary artery stenosis   . Nerve root pain 05/07/2014  . Non-ST elevation (NSTEMI) myocardial infarction ([place])  10/03/2019  . Opioid type dependence, abuse ([place]) 01/19/2013  . Osteoarthritis of spine with radiculopathy, lumbar region 05/07/2014  . Osteoporosis 12/30/2012  . Personal history of other venous thrombosis and embolism 04/10/2012   Overview:  Overview:  Overview:  postoperative Overview:  postoperative Overview:  postoperative  . Pulmonary embolism ([place]) 07/08/2015  . PVD (peripheral vascular disease) ([place])   . Recurrent UTI 05/23/2014  . S/P lumbar spine operation 02/03/2012  . Seizure disorder ([place])   . SIRS (systemic inflammatory response syndrome) ([place]) 07/07/2015  . Spondylosis, lumbosacral   . Stricture of artery [facility])     Past Surgical History:  Procedure Laterality Date  . APPENDECTOMY    . BACK SURGERY    . JOINT REPLACEMENT    . LEFT HEART CATH AND CORONARY ANGIOGRAPHY N/A 10/03/2019   Procedure: LEFT HEART CATH AND CORONARY ANGIOGRAPHY;  Surgeon: Martinique, Peter M, MD;  Location: [place] [facility];  Service: Cardiovascular;  Laterality: N/A;  . SPINAL CORD STIMULATOR IMPLANT  2010  . stent  1990  . VESICOVAGINAL FISTULA CLOSURE W/ TAH  1962    Current Medications: Current Meds  Medication Sig  . acetaminophen (TYLENOL) 325 MG tablet Take 650 mg by mouth every 6 (six) hours as needed.  Marland Kitchen albuterol (PROVENTIL) (2.5 MG/3ML) 0.083% nebulizer solution Take 2.5 mg by nebulization every 4 (four) hours as needed for [name].   Marland Kitchen amitriptyline (ELAVIL) 10 MG tablet Take 10 mg  by mouth daily.  Marland Kitchen atorvastatin (LIPITOR) 40 MG tablet Take 1 tablet (40 mg total) by mouth daily at 6 PM.  . bisacodyl (BISACODYL) 5 MG EC tablet Take 5 mg by mouth every other day.  . Buprenorphine HCl (BELBUCA) 600 MCG FILM Place 2 Film inside cheek daily.   . carvedilol (COREG) 12.5 MG tablet Take 1 tablet (12.5 mg total) by mouth 2 (two) times daily with a meal.  . Cholecalciferol 1000 UNITS capsule Take 1,000 Units by mouth daily.  . DULoxetine (CYMBALTA) 30 MG capsule Take 30 mg by mouth 2  (two) times daily.  . folic acid (FOLVITE) 1 MG tablet Take 1 mg by mouth daily.  Marland Kitchen gabapentin (NEURONTIN) 300 MG capsule Take 300 mg by mouth See admin instructions. 300mg  in the morning,300mg  at noon and 600mg  at night  . HEMATINIC/FOLIC ACID 235-3 MG TABS Take 1 tablet by mouth daily.  Marland Kitchen levETIRAcetam (KEPPRA) 500 MG tablet Take 500 mg by mouth 2 (two) times daily.  Marland Kitchen levothyroxine (SYNTHROID, LEVOTHROID) 50 MCG tablet Take 50 mcg by mouth daily before breakfast.  . lidocaine (XYLOCAINE) 5 % ointment 1 application daily as needed for moderate pain.   Marland Kitchen loperamide (IMODIUM) 2 MG capsule Take 2 mg by mouth as needed for diarrhea or loose stools.  Marland Kitchen losartan (COZAAR) 50 MG tablet Take 1-2 tablets (50-100 mg total) by mouth See admin instructions. TAKE ONLY 50 MG DAILY. IF SYSTOLIC BLOOD PRESSURE HIGHER THAN 150 TAKE EXTRA 50 MG.  . Multiple Vitamins-Minerals ([employer] SPECTRAVITE ADULT 50+ PO) Take 1 tablet by mouth daily.  . nitroGLYCERIN (NITROSTAT) 0.4 MG SL tablet Place 0.4 mg under the tongue every 5 (five) minutes as needed for chest pain.  Marland Kitchen omeprazole (PRILOSEC) 40 MG capsule Take 40 mg by mouth daily.   . ondansetron (ZOFRAN) 8 MG tablet Take 8 mg by mouth every 8 (eight) hours as needed for vomiting.   . OXYGEN Inhale 3 L into the lungs continuous.   . rivaroxaban (XARELTO) 10 MG TABS tablet Take 1 tablet (10 mg total) by mouth daily.  Marland Kitchen thiamine (VITAMIN B-1) 100 MG tablet Take 50 mg by mouth 2 (two) times daily.   . Tiotropium Bromide Monohydrate (SPIRIVA RESPIMAT) 2.5 MCG/ACT AERS Inhale 1 puff into the lungs as needed ([name]).   . vitamin C (ASCORBIC ACID) 500 MG tablet Take 250 mg by mouth daily.     Allergies:   Asa [aspirin], Bee venom, Duloxetine hcl, Hornet venom, Phenergan [promethazine hcl], Penicillins, and Promethazine   Social History   Socioeconomic History  . Marital status: Single    Spouse name: Not on file  . Number of children: Not on file  . Years  of education: Not on file  . Highest education level: Not on file  Occupational History  . Occupation: Retired    Comment: [employer]  Tobacco Use  . Smoking status: Former Smoker    Packs/day: 1.50    Years: 12.00    Pack years: 18.00    Types: Cigarettes    Quit date: 07/27/1995    Years since quitting: 24.2  . Smokeless tobacco: Never Used  Substance and Sexual Activity  . Alcohol use: No  . Drug use: No  . Sexual activity: Not on file  Other Topics Concern  . Not on file  Social History Narrative  . Not on file   Social Determinants of Health   Financial Resource Strain:   . Difficulty of Paying Living  Expenses:   Food Insecurity:   . Worried About Charity fundraiser in the Last Year:   . Arboriculturist in the Last Year:   Transportation Needs:   . Film/video editor (Medical):   Marland Kitchen Lack of Transportation (Non-Medical):   Physical Activity:   . Days of Exercise per Week:   . Minutes of Exercise per Session:   Stress:   . Feeling of Stress :   Social Connections:   . Frequency of Communication with Friends and Family:   . Frequency of Social Gatherings with Friends and Family:   . Attends Religious Services:   . Active Member of Clubs or Organizations:   . Attends Archivist Meetings:   Marland Kitchen Marital Status:      Family History: The patient's family history includes Heart disease in her brother, father, and mother. ROS:   Please see the history of present illness.    All other systems reviewed and are negative.  EKGs/Labs/Other Studies Reviewed:    The following studies were reviewed today:   Recent Labs: 10/03/2019: Hemoglobin 14.5; Platelets 230 10/04/2019: ALT 19; B Natriuretic Peptide 267.7; BUN 16; Creatinine, Ser 1.09; Potassium 3.9; Sodium 140; TSH 3.378  Recent Lipid Panel    Component Value Date/Time   CHOL 114 08/23/2019 1502   TRIG 112 08/23/2019 1502   HDL 59 08/23/2019 1502   CHOLHDL 1.9 08/23/2019 1502   LDLCALC 35  08/23/2019 1502    Physical Exam:    VS:  BP 120/62   Pulse 83   Ht 5\' 5"  (1.651 m)   Wt 150 lb (68 kg)   SpO2 (!) 70%   BMI 24.96 kg/m     Wt Readings from Last 3 Encounters:  10/25/19 150 lb (68 kg)  10/04/19 157 lb 6.5 oz (71.4 kg)  08/23/19 160 lb (72.6 kg)     GEN:  Well nourished, well developed in no acute distress HEENT: Normal NECK: No JVD; No carotid bruits LYMPHATICS: No lymphadenopathy CARDIAC: RRR, no murmurs, rubs, gallops RESPIRATORY:  Clear to auscultation without rales, wheezing or rhonchi  ABDOMEN: Soft, non-tender, non-distended MUSCULOSKELETAL:  No edema; No deformity  SKIN: Warm and dry NEUROLOGIC:  Alert and oriented x 3 PSYCHIATRIC:  Normal affect    Signed, Cheyenne More, MD  10/25/2019 2:21 PM    [facility]

## 2019-10-25 NOTE — Patient Instructions (Signed)

## 2019-10-28 DIAGNOSIS — N179 Acute kidney failure, unspecified: Secondary | ICD-10-CM | POA: Diagnosis not present

## 2019-10-28 DIAGNOSIS — I951 Orthostatic hypotension: Secondary | ICD-10-CM | POA: Diagnosis not present

## 2019-10-28 DIAGNOSIS — N390 Urinary tract infection, site not specified: Secondary | ICD-10-CM | POA: Diagnosis not present

## 2019-10-28 DIAGNOSIS — J189 Pneumonia, unspecified organism: Secondary | ICD-10-CM | POA: Diagnosis not present

## 2019-10-28 DIAGNOSIS — R531 Weakness: Secondary | ICD-10-CM

## 2019-10-29 DIAGNOSIS — J189 Pneumonia, unspecified organism: Secondary | ICD-10-CM | POA: Diagnosis not present

## 2019-10-29 DIAGNOSIS — N390 Urinary tract infection, site not specified: Secondary | ICD-10-CM | POA: Diagnosis not present

## 2019-10-29 DIAGNOSIS — I951 Orthostatic hypotension: Secondary | ICD-10-CM | POA: Diagnosis not present

## 2019-10-29 DIAGNOSIS — N179 Acute kidney failure, unspecified: Secondary | ICD-10-CM | POA: Diagnosis not present

## 2019-10-30 DIAGNOSIS — N390 Urinary tract infection, site not specified: Secondary | ICD-10-CM | POA: Diagnosis not present

## 2019-10-30 DIAGNOSIS — J189 Pneumonia, unspecified organism: Secondary | ICD-10-CM | POA: Diagnosis not present

## 2019-10-30 DIAGNOSIS — N179 Acute kidney failure, unspecified: Secondary | ICD-10-CM | POA: Diagnosis not present

## 2019-10-30 DIAGNOSIS — I951 Orthostatic hypotension: Secondary | ICD-10-CM | POA: Diagnosis not present

## 2019-10-31 DIAGNOSIS — N179 Acute kidney failure, unspecified: Secondary | ICD-10-CM | POA: Diagnosis not present

## 2019-10-31 DIAGNOSIS — N390 Urinary tract infection, site not specified: Secondary | ICD-10-CM | POA: Diagnosis not present

## 2019-10-31 DIAGNOSIS — J189 Pneumonia, unspecified organism: Secondary | ICD-10-CM | POA: Diagnosis not present

## 2019-10-31 DIAGNOSIS — I951 Orthostatic hypotension: Secondary | ICD-10-CM | POA: Diagnosis not present

## 2019-11-01 DIAGNOSIS — N390 Urinary tract infection, site not specified: Secondary | ICD-10-CM | POA: Diagnosis not present

## 2019-11-01 DIAGNOSIS — N179 Acute kidney failure, unspecified: Secondary | ICD-10-CM | POA: Diagnosis not present

## 2019-11-01 DIAGNOSIS — J189 Pneumonia, unspecified organism: Secondary | ICD-10-CM | POA: Diagnosis not present

## 2019-11-01 DIAGNOSIS — I951 Orthostatic hypotension: Secondary | ICD-10-CM | POA: Diagnosis not present

## 2019-11-15 ENCOUNTER — Telehealth: Payer: Self-pay | Admitting: Cardiology

## 2019-11-15 NOTE — Telephone Encounter (Signed)
Not uncommon use the higher arm for measurement

## 2019-11-15 NOTE — Telephone Encounter (Signed)
Pt c/o BP issue: STAT if pt c/o blurred vision, one-sided weakness or slurred speech  1. What are your last 5 BP readings?   R Arm: 04-20: 120/72 04-21: 97/73    04-22: 110/70  L Arm: 04-20: 160/68  04-21: 147/68  04-22: 90/50   2. Are you having any other symptoms (ex. Dizziness, headache, blurred vision, passed out)? no  3. What is your BP issue? Husband called and said there is a significant difference in BP readings between the patient's R and L arms. The patient spoke with another nurse who said this could be an indication of a serious condition which he can not remember.   The patient is being treated by a UTI by Dr. Chancy Milroy, and the UTI can cause dizziness, blurred vision and slurred speech. The Husband does not know if the patient's symptoms are caused by a heart issue or the UTI. The patient was given amoxicillin for her UTI

## 2019-11-15 NOTE — Telephone Encounter (Signed)
I am routing this message to Dr. Bettina Gavia for further advise/recommendations.

## 2019-11-16 NOTE — Telephone Encounter (Signed)
Spoke with patients husband and let him know Dr. Joya Gaskins advise and recommendations.   He verbalizes understanding and is grateful for the call back.   No other issues or concerns noted.   Encouraged patient to call back with any questions or concerns.

## 2019-12-25 DEATH — deceased

## 2020-03-25 IMAGING — CR DG CHEST 2V
2 series · 2 of 2 positions shown · non-contrast
Comparison: Chest radiograph 10/01/2019; CT chest 10/01/2019

CLINICAL DATA: Patient with shortness of breath.

EXAM:
CHEST - 2 VIEW

[chest lat]
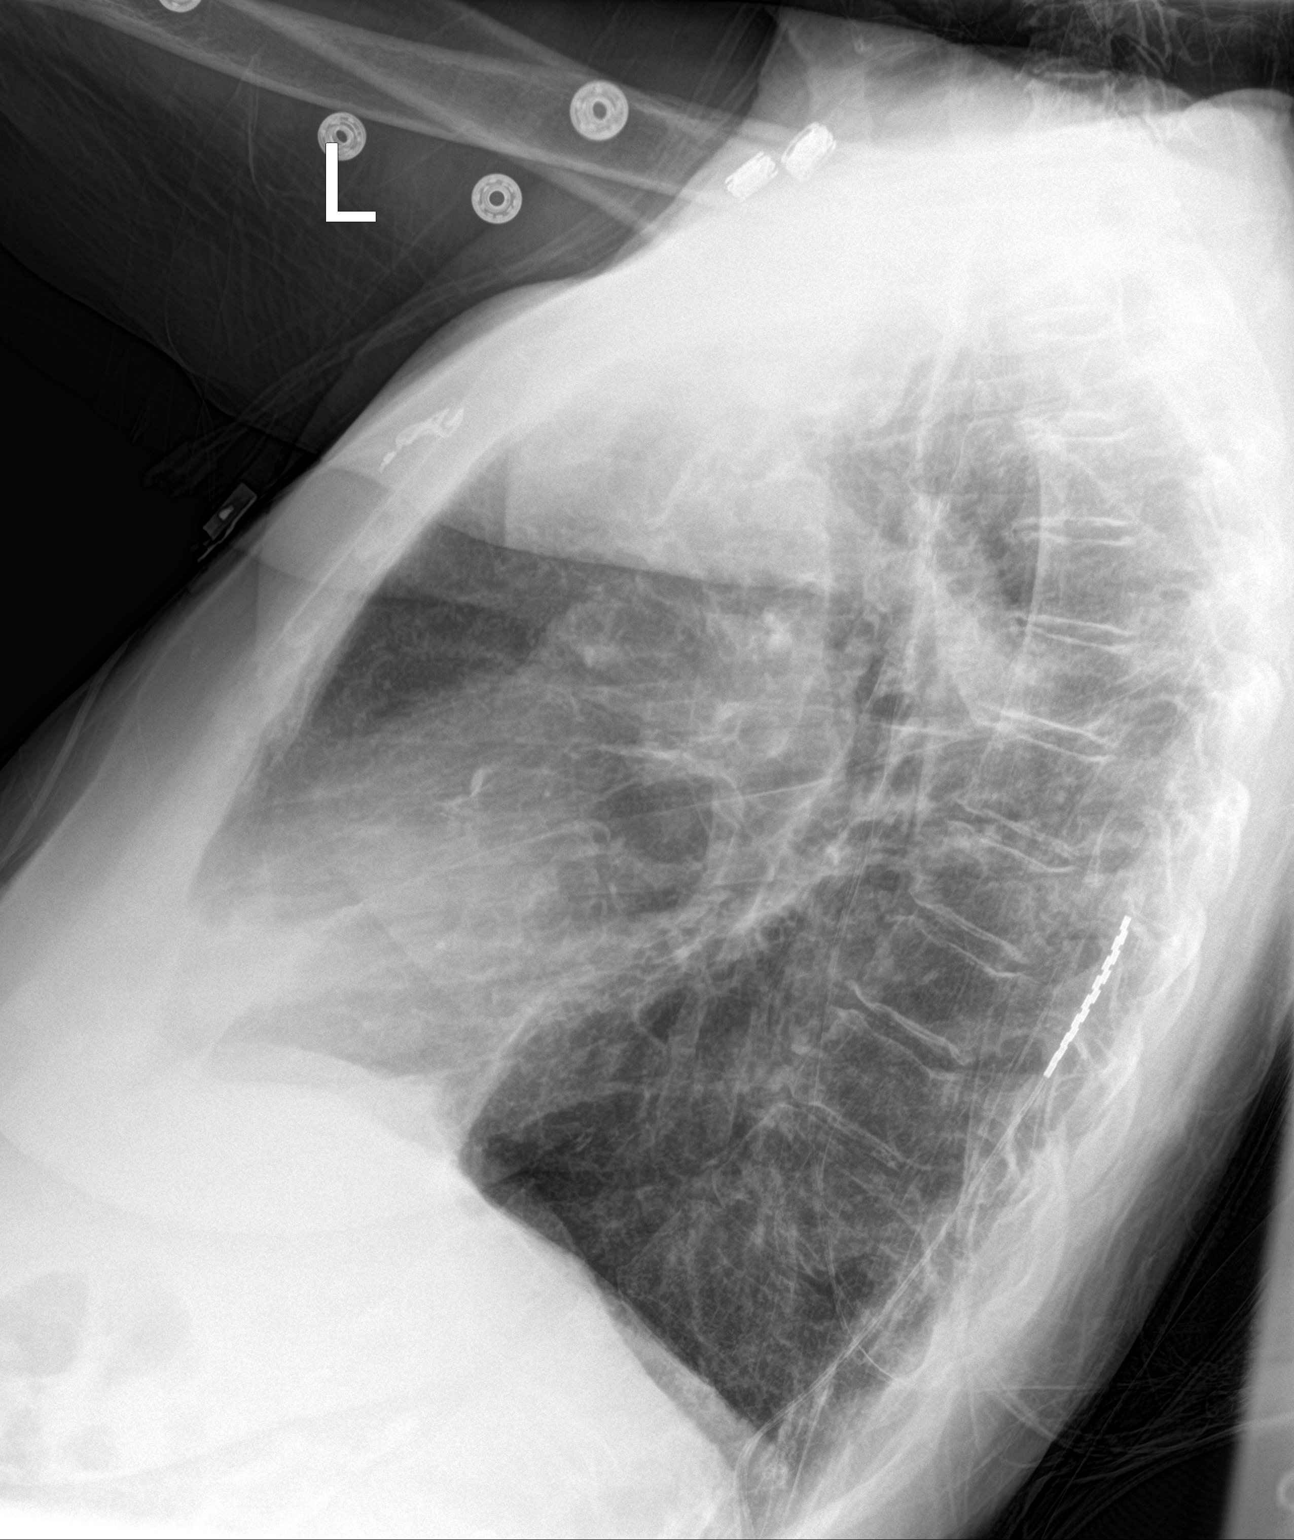

[chest ap]
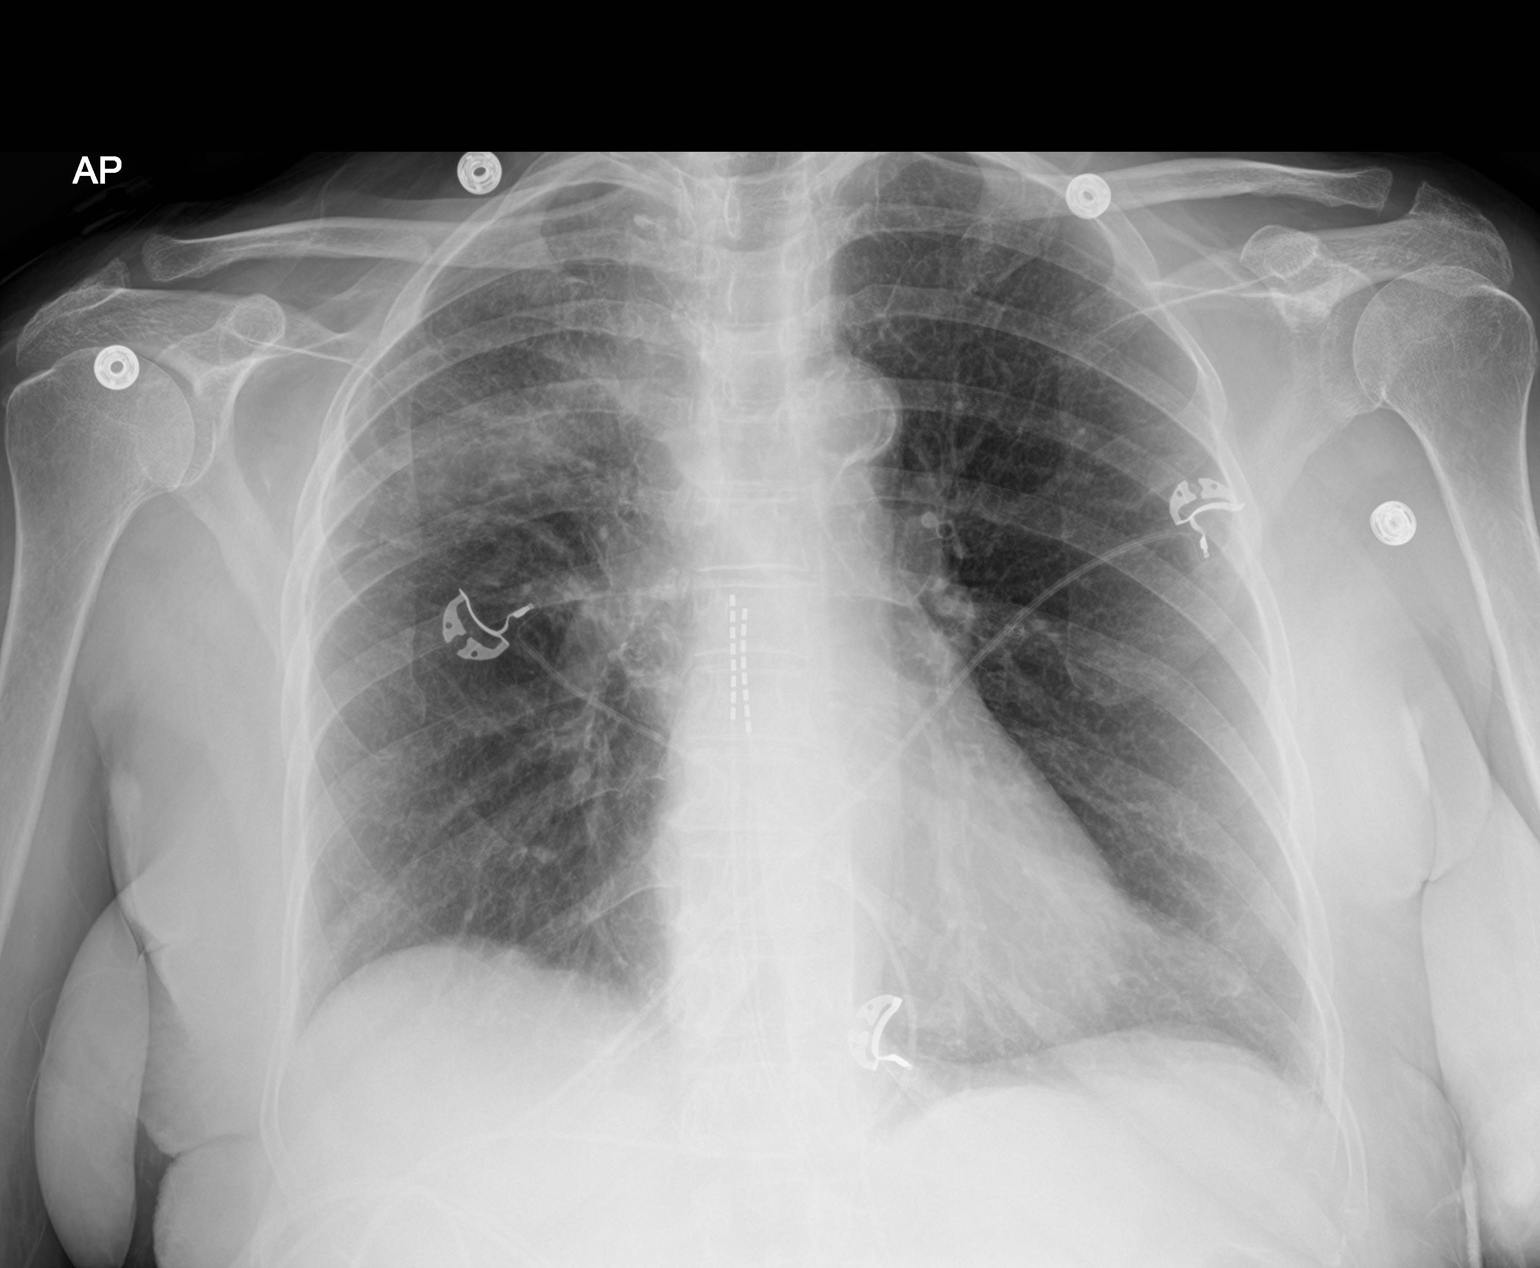

[2 of 2 positions shown; findings below may reference images not displayed]

FINDINGS: Stable cardiac and mediastinal contours. Monitoring leads overlie
the patient. Thoracic spinal stimulator device. Stable opacity
within the right upper lung. No new pulmonary opacities. No pleural
effusion or pneumothorax. Osseous structures unremarkable.
IMPRESSION: No acute cardiopulmonary process.
